# Patient Record
Sex: Female | Born: 1985 | Race: White | Hispanic: No | Marital: Married | Smoking: Former smoker
Health system: Southern US, Community
[De-identification: ages and names within clinical notes are randomized; demographics above are authoritative.]

## PROBLEM LIST (undated history)

## (undated) ENCOUNTER — Inpatient Hospital Stay (HOSPITAL_COMMUNITY): Payer: Self-pay

## (undated) ENCOUNTER — Inpatient Hospital Stay (HOSPITAL_COMMUNITY): Payer: PRIVATE HEALTH INSURANCE

## (undated) DIAGNOSIS — Z8759 Personal history of other complications of pregnancy, childbirth and the puerperium: Secondary | ICD-10-CM

## (undated) DIAGNOSIS — N879 Dysplasia of cervix uteri, unspecified: Secondary | ICD-10-CM

## (undated) DIAGNOSIS — R030 Elevated blood-pressure reading, without diagnosis of hypertension: Secondary | ICD-10-CM

## (undated) DIAGNOSIS — F988 Other specified behavioral and emotional disorders with onset usually occurring in childhood and adolescence: Secondary | ICD-10-CM

## (undated) HISTORY — PX: WISDOM TOOTH EXTRACTION: SHX21

## (undated) HISTORY — DX: Dysplasia of cervix uteri, unspecified: N87.9

## (undated) HISTORY — DX: Other specified behavioral and emotional disorders with onset usually occurring in childhood and adolescence: F98.8

## (undated) HISTORY — DX: Elevated blood-pressure reading, without diagnosis of hypertension: R03.0

## (undated) HISTORY — PX: DILATION AND CURETTAGE OF UTERUS: SHX78

## (undated) HISTORY — DX: Personal history of other complications of pregnancy, childbirth and the puerperium: Z87.59

---

## 2012-12-17 DIAGNOSIS — R030 Elevated blood-pressure reading, without diagnosis of hypertension: Secondary | ICD-10-CM | POA: Insufficient documentation

## 2012-12-17 DIAGNOSIS — N879 Dysplasia of cervix uteri, unspecified: Secondary | ICD-10-CM | POA: Insufficient documentation

## 2012-12-17 DIAGNOSIS — F988 Other specified behavioral and emotional disorders with onset usually occurring in childhood and adolescence: Secondary | ICD-10-CM | POA: Insufficient documentation

## 2012-12-23 ENCOUNTER — Ambulatory Visit (INDEPENDENT_AMBULATORY_CARE_PROVIDER_SITE_OTHER): Payer: PRIVATE HEALTH INSURANCE | Admitting: Family Medicine

## 2012-12-23 ENCOUNTER — Encounter: Payer: Self-pay | Admitting: Family Medicine

## 2012-12-23 VITALS — BP 126/85 | HR 87 | Ht 60.5 in | Wt 101.0 lb

## 2012-12-23 DIAGNOSIS — Z0000 Encounter for general adult medical examination without abnormal findings: Secondary | ICD-10-CM

## 2012-12-23 LAB — POCT URINALYSIS DIPSTICK
Bilirubin, UA: NEGATIVE
Blood, UA: NEGATIVE
Glucose, UA: NEGATIVE
Ketones, UA: NEGATIVE
Leukocytes, UA: NEGATIVE
Nitrite, UA: NEGATIVE
Protein, UA: NEGATIVE
Spec Grav, UA: 1.02
Urobilinogen, UA: NEGATIVE
pH, UA: 5

## 2012-12-23 MED ORDER — ETONOGESTREL-ETHINYL ESTRADIOL 0.12-0.015 MG/24HR VA RING: VAGINAL_RING | VAGINAL | Status: DC

## 2012-12-23 MED ORDER — FOLIC ACID 1 MG PO TABS: 1.0000 mg | ORAL_TABLET | Freq: Every day | ORAL | Status: AC

## 2012-12-23 MED ORDER — LISDEXAMFETAMINE DIMESYLATE 70 MG PO CAPS: 70.0000 mg | ORAL_CAPSULE | Freq: Every day | ORAL | Status: DC

## 2012-12-23 NOTE — Progress Notes (Signed)
Patient ID: Diana Palmer, female   DOB: [birth year], 27 y.o.   MRN: [id number]    HPI Diana Palmer is here today for her annual CPE.  She has done well since her last office visit.  She needs refills for her Vyvanse and Nuvaring.  Her dosage seems to be appropriate.  She denies sleeping or eating problems.    Past Medical History  Diagnosis Date  . Cervical dysplasia   . Attention deficit disorder without mention of hyperactivity   . Elevated blood pressure reading without diagnosis of hypertension    No past surgical history on file. Family History  Problem Relation Age of Onset  . Hypertension Father   . Breast cancer Paternal Grandmother   . Hypertension Paternal Grandfather     Social History History  Substance Use Topics  . Smoking status: Former Smoker -- 1.00 packs/day for 1 years    Types: Cigarettes    Quit date: 10/05/2006  . Smokeless tobacco: Not on file  . Alcohol Use: 2.5 oz/week    5 drink(s) per week     Comment: She drinks beer and wine.     Current Outpatient Prescriptions  Medication Sig Dispense Refill  . etonogestrel-ethinyl estradiol (NUVARING) 0.12-0.015 MG/24HR vaginal ring Insert vaginally and leave in place for 3 consecutive weeks, then remove for 1 week.  1 each  11  . lisdexamfetamine (VYVANSE) 70 MG capsule Take 1 capsule (70 mg total) by mouth daily.  30 capsule  0  . lisdexamfetamine (VYVANSE) 70 MG capsule Take 1 capsule (70 mg total) by mouth daily.  30 capsule  0  . folic acid (FOLVITE) 1 MG tablet Take 1 tablet (1 mg total) by mouth daily.  90 tablet  3   No current facility-administered medications for this visit.   No Known Allergies  Review of Systems  Constitutional: Negative.  Negative for fever, chills, diaphoresis, activity change, appetite change, fatigue and unexpected weight change.  HENT: Negative for hearing loss, ear pain, nosebleeds, congestion, sore throat, facial swelling, rhinorrhea, sneezing, drooling, mouth sores, trouble  swallowing, neck pain, neck stiffness, dental problem, voice change, postnasal drip, sinus pressure, tinnitus and ear discharge.   Eyes: Negative for photophobia, pain, discharge, redness, itching and visual disturbance.  Respiratory: Negative for apnea, cough, choking, chest tightness, shortness of breath, wheezing and stridor.   Cardiovascular: Negative for chest pain, palpitations and leg swelling.  Gastrointestinal: Negative for nausea, vomiting, abdominal pain, diarrhea, constipation, blood in stool, abdominal distention, anal bleeding and rectal pain.  Endocrine: Negative for cold intolerance, heat intolerance, polydipsia, polyphagia and polyuria.  Genitourinary: Negative for dysuria, urgency, frequency, hematuria, flank pain, decreased urine volume, vaginal bleeding, vaginal discharge, enuresis, difficulty urinating, genital sores, vaginal pain, menstrual problem, pelvic pain and dyspareunia.  Musculoskeletal: Negative for myalgias, back pain, joint swelling, arthralgias and gait problem.  Skin: Negative for color change, pallor, rash and wound.  Allergic/Immunologic: Negative for environmental allergies, food allergies and immunocompromised state.  Neurological: Negative for dizziness, tremors, seizures, syncope, facial asymmetry, speech difficulty, weakness, light-headedness, numbness and headaches.  Hematological: Negative for adenopathy. Does not bruise/bleed easily.  Psychiatric/Behavioral: Negative for suicidal ideas, hallucinations, behavioral problems, confusion, sleep disturbance, self-injury, dysphoric mood, decreased concentration and agitation. The patient is not nervous/anxious and is not hyperactive.   All other systems reviewed and are negative.   BP 126/85  Pulse 87  Ht 5' 0.5" (1.537 m)  Wt 101 lb (45.813 kg)  BMI 19.39 kg/m2  LMP 12/03/2012 Physical Exam  Constitutional:  She is oriented to person, place, and time. She appears well-developed and well-nourished.  HENT:   Head: Normocephalic and atraumatic.  Right Ear: External ear normal.  Left Ear: External ear normal.  Nose: Nose normal.  Mouth/Throat: Oropharynx is clear and moist.  Eyes: Conjunctivae are normal. Pupils are equal, round, and reactive to light. No scleral icterus.  Neck: Normal range of motion. Neck supple. No thyromegaly present.  Cardiovascular: Normal rate, regular rhythm, normal heart sounds and intact distal pulses.  Exam reveals no gallop and no friction rub.   No murmur heard. Pulmonary/Chest: Effort normal and breath sounds normal. Right breast exhibits no mass, no nipple discharge, no skin change and no tenderness. Left breast exhibits no mass, no nipple discharge, no skin change and no tenderness. Breasts are symmetrical.  Abdominal: Soft. Bowel sounds are normal. Hernia confirmed negative in the right inguinal area and confirmed negative in the left inguinal area.  Genitourinary: Rectum normal, vagina normal and uterus normal. No breast swelling, tenderness or discharge. Cervix exhibits no motion tenderness, no discharge and no friability. Right adnexum displays no mass, no tenderness and no fullness. Left adnexum displays no mass, no tenderness and no fullness.  Musculoskeletal: Normal range of motion. She exhibits no edema and no tenderness.  Lymphadenopathy:    She has no cervical adenopathy.       Right: No inguinal adenopathy present.       Left: No inguinal adenopathy present.  Neurological: She is alert and oriented to person, place, and time. She has normal reflexes.  Skin: Skin is warm and dry.  Psychiatric: She has a normal mood and affect. Her behavior is normal. Judgment and thought content normal.    Assessment:  1)  CPE 2)  ADD    Plan:  We discussed the new guidelines for pap smear. We'll do her next pap after she turns 28 and will check her HPV status at that time.  She and her husband are thinking about having kids in the future. She is to start on folic  acid.  She was given refills for her Vyvanse and her Nuvaring.

## 2012-12-23 NOTE — Patient Instructions (Addendum)
Preventive Care for Adults, Female A healthy lifestyle and preventive care can promote health and wellness. Preventive health guidelines for women include the following key practices.  A routine yearly physical is a good way to check with your caregiver about your health and preventive screening. It is a chance to share any concerns and updates on your health, and to receive a thorough exam.  Visit your dentist for a routine exam and preventive care every 6 months. Brush your teeth twice a day and floss once a day. Good oral hygiene prevents tooth decay and gum disease.  The frequency of eye exams is based on your age, health, family medical history, use of contact lenses, and other factors. Follow your caregiver's recommendations for frequency of eye exams.  Eat a healthy diet. Foods like vegetables, fruits, whole grains, low-fat dairy products, and lean protein foods contain the nutrients you need without too many calories. Decrease your intake of foods high in solid fats, added sugars, and salt. Eat the right amount of calories for you.Get information about a proper diet from your caregiver, if necessary.  Regular physical exercise is one of the most important things you can do for your health. Most adults should get at least 150 minutes of moderate-intensity exercise (any activity that increases your heart rate and causes you to sweat) each week. In addition, most adults need muscle-strengthening exercises on 2 or more days a week.  Maintain a healthy weight. The body mass index (BMI) is a screening tool to identify possible weight problems. It provides an estimate of body fat based on height and weight. Your caregiver can help determine your BMI, and can help you achieve or maintain a healthy weight.For adults 20 years and older:  A BMI below 18.5 is considered underweight.  A BMI of 18.5 to 24.9 is normal.  A BMI of 25 to 29.9 is considered overweight.  A BMI of 30 and above is  considered obese.  Maintain normal blood lipids and cholesterol levels by exercising and minimizing your intake of saturated fat. Eat a balanced diet with plenty of fruit and vegetables. Blood tests for lipids and cholesterol should begin at age 20 and be repeated every 5 years. If your lipid or cholesterol levels are high, you are over 50, or you are at high risk for heart disease, you may need your cholesterol levels checked more frequently.Ongoing high lipid and cholesterol levels should be treated with medicines if diet and exercise are not effective.  If you smoke, find out from your caregiver how to quit. If you do not use tobacco, do not start.  If you are pregnant, do not drink alcohol. If you are breastfeeding, be very cautious about drinking alcohol. If you are not pregnant and choose to drink alcohol, do not exceed 1 drink per day. One drink is considered to be 12 ounces (355 mL) of beer, 5 ounces (148 mL) of wine, or 1.5 ounces (44 mL) of liquor.  Avoid use of street drugs. Do not share needles with anyone. Ask for help if you need support or instructions about stopping the use of drugs.  High blood pressure causes heart disease and increases the risk of stroke. Your blood pressure should be checked at least every 1 to 2 years. Ongoing high blood pressure should be treated with medicines if weight loss and exercise are not effective.  If you are 55 to 29 years old, ask your caregiver if you should take aspirin to prevent strokes.  Diabetes   screening involves taking a blood sample to check your fasting blood sugar level. This should be done once every 3 years, after age 45, if you are within normal weight and without risk factors for diabetes. Testing should be considered at a younger age or be carried out more frequently if you are overweight and have at least 1 risk factor for diabetes.  Breast cancer screening is essential preventive care for women. You should practice "breast  self-awareness." This means understanding the normal appearance and feel of your breasts and may include breast self-examination. Any changes detected, no matter how small, should be reported to a caregiver. Women in their 20s and 30s should have a clinical breast exam (CBE) by a caregiver as part of a regular health exam every 1 to 3 years. After age 40, women should have a CBE every year. Starting at age 40, women should consider having a mammography (breast X-ray test) every year. Women who have a family history of breast cancer should talk to their caregiver about genetic screening. Women at a high risk of breast cancer should talk to their caregivers about having magnetic resonance imaging (MRI) and a mammography every year.  The Pap test is a screening test for cervical cancer. A Pap test can show cell changes on the cervix that might become cervical cancer if left untreated. A Pap test is a procedure in which cells are obtained and examined from the lower end of the uterus (cervix).  Women should have a Pap test starting at age 21.  Between ages 21 and 29, Pap tests should be repeated every 2 years.  Beginning at age 30, you should have a Pap test every 3 years as long as the past 3 Pap tests have been normal.  Some women have medical problems that increase the chance of getting cervical cancer. Talk to your caregiver about these problems. It is especially important to talk to your caregiver if a new problem develops soon after your last Pap test. In these cases, your caregiver may recommend more frequent screening and Pap tests.  The above recommendations are the same for women who have or have not gotten the vaccine for human papillomavirus (HPV).  If you had a hysterectomy for a problem that was not cancer or a condition that could lead to cancer, then you no longer need Pap tests. Even if you no longer need a Pap test, a regular exam is a good idea to make sure no other problems are  starting.  If you are between ages 65 and 70, and you have had normal Pap tests going back 10 years, you no longer need Pap tests. Even if you no longer need a Pap test, a regular exam is a good idea to make sure no other problems are starting.  If you have had past treatment for cervical cancer or a condition that could lead to cancer, you need Pap tests and screening for cancer for at least 20 years after your treatment.  If Pap tests have been discontinued, risk factors (such as a new sexual partner) need to be reassessed to determine if screening should be resumed.  The HPV test is an additional test that may be used for cervical cancer screening. The HPV test looks for the virus that can cause the cell changes on the cervix. The cells collected during the Pap test can be tested for HPV. The HPV test could be used to screen women aged 30 years and older, and should   be used in women of any age who have unclear Pap test results. After the age of 30, women should have HPV testing at the same frequency as a Pap test.  Colorectal cancer can be detected and often prevented. Most routine colorectal cancer screening begins at the age of 50 and continues through age 75. However, your caregiver may recommend screening at an earlier age if you have risk factors for colon cancer. On a yearly basis, your caregiver may provide home test kits to check for hidden blood in the stool. Use of a small camera at the end of a tube, to directly examine the colon (sigmoidoscopy or colonoscopy), can detect the earliest forms of colorectal cancer. Talk to your caregiver about this at age 50, when routine screening begins. Direct examination of the colon should be repeated every 5 to 10 years through age 75, unless early forms of pre-cancerous polyps or small growths are found.  Hepatitis C blood testing is recommended for all people born from 1945 through 1965 and any individual with known risks for hepatitis C.  Practice  safe sex. Use condoms and avoid high-risk sexual practices to reduce the spread of sexually transmitted infections (STIs). STIs include gonorrhea, chlamydia, syphilis, trichomonas, herpes, HPV, and human immunodeficiency virus (HIV). Herpes, HIV, and HPV are viral illnesses that have no cure. They can result in disability, cancer, and death. Sexually active women aged 25 and younger should be checked for chlamydia. Older women with new or multiple partners should also be tested for chlamydia. Testing for other STIs is recommended if you are sexually active and at increased risk.  Osteoporosis is a disease in which the bones lose minerals and strength with aging. This can result in serious bone fractures. The risk of osteoporosis can be identified using a bone density scan. Women ages 65 and over and women at risk for fractures or osteoporosis should discuss screening with their caregivers. Ask your caregiver whether you should take a calcium supplement or vitamin D to reduce the rate of osteoporosis.  Menopause can be associated with physical symptoms and risks. Hormone replacement therapy is available to decrease symptoms and risks. You should talk to your caregiver about whether hormone replacement therapy is right for you.  Use sunscreen with sun protection factor (SPF) of 30 or more. Apply sunscreen liberally and repeatedly throughout the day. You should seek shade when your shadow is shorter than you. Protect yourself by wearing long sleeves, pants, a wide-brimmed hat, and sunglasses year round, whenever you are outdoors.  Once a month, do a whole body skin exam, using a mirror to look at the skin on your back. Notify your caregiver of new moles, moles that have irregular borders, moles that are larger than a pencil eraser, or moles that have changed in shape or color.  Stay current with required immunizations.  Influenza. You need a dose every fall (or winter). The composition of the flu vaccine  changes each year, so being vaccinated once is not enough.  Pneumococcal polysaccharide. You need 1 to 2 doses if you smoke cigarettes or if you have certain chronic medical conditions. You need 1 dose at age 65 (or older) if you have never been vaccinated.  Tetanus, diphtheria, pertussis (Tdap, Td). Get 1 dose of Tdap vaccine if you are younger than age 65, are over 65 and have contact with an infant, are a healthcare worker, are pregnant, or simply want to be protected from whooping cough. After that, you need a Td   booster dose every 10 years. Consult your caregiver if you have not had at least 3 tetanus and diphtheria-containing shots sometime in your life or have a deep or dirty wound.  HPV. You need this vaccine if you are a woman age 26 or younger. The vaccine is given in 3 doses over 6 months.  Measles, mumps, rubella (MMR). You need at least 1 dose of MMR if you were born in 1957 or later. You may also need a second dose.  Meningococcal. If you are age 19 to 21 and a first-year college student living in a residence hall, or have one of several medical conditions, you need to get vaccinated against meningococcal disease. You may also need additional booster doses.  Zoster (shingles). If you are age 60 or older, you should get this vaccine.  Varicella (chickenpox). If you have never had chickenpox or you were vaccinated but received only 1 dose, talk to your caregiver to find out if you need this vaccine.  Hepatitis A. You need this vaccine if you have a specific risk factor for hepatitis A virus infection or you simply wish to be protected from this disease. The vaccine is usually given as 2 doses, 6 to 18 months apart.  Hepatitis B. You need this vaccine if you have a specific risk factor for hepatitis B virus infection or you simply wish to be protected from this disease. The vaccine is given in 3 doses, usually over 6 months. Preventive Services / Frequency Ages 19 to 39  Blood  pressure check.** / Every 1 to 2 years.  Lipid and cholesterol check.** / Every 5 years beginning at age 20.  Clinical breast exam.** / Every 3 years for women in their 20s and 30s.  Pap test.** / Every 2 years from ages 21 through 29. Every 3 years starting at age 30 through age 65 or 70 with a history of 3 consecutive normal Pap tests.  HPV screening.** / Every 3 years from ages 30 through ages 65 to 70 with a history of 3 consecutive normal Pap tests.  Hepatitis C blood test.** / For any individual with known risks for hepatitis C.  Skin self-exam. / Monthly.  Influenza immunization.** / Every year.  Pneumococcal polysaccharide immunization.** / 1 to 2 doses if you smoke cigarettes or if you have certain chronic medical conditions.  Tetanus, diphtheria, pertussis (Tdap, Td) immunization. / A one-time dose of Tdap vaccine. After that, you need a Td booster dose every 10 years.  HPV immunization. / 3 doses over 6 months, if you are 26 and younger.  Measles, mumps, rubella (MMR) immunization. / You need at least 1 dose of MMR if you were born in 1957 or later. You may also need a second dose.  Meningococcal immunization. / 1 dose if you are age 19 to 21 and a first-year college student living in a residence hall, or have one of several medical conditions, you need to get vaccinated against meningococcal disease. You may also need additional booster doses.  Varicella immunization.** / Consult your caregiver.  Hepatitis A immunization.** / Consult your caregiver. 2 doses, 6 to 18 months apart.  Hepatitis B immunization.** / Consult your caregiver. 3 doses usually over 6 months. Ages 40 to 64  Blood pressure check.** / Every 1 to 2 years.  Lipid and cholesterol check.** / Every 5 years beginning at age 20.  Clinical breast exam.** / Every year after age 40.  Mammogram.** / Every year beginning at age 40   and continuing for as long as you are in good health. Consult with your  caregiver.  Pap test.** / Every 3 years starting at age 30 through age 65 or 70 with a history of 3 consecutive normal Pap tests.  HPV screening.** / Every 3 years from ages 30 through ages 65 to 70 with a history of 3 consecutive normal Pap tests.  Fecal occult blood test (FOBT) of stool. / Every year beginning at age 50 and continuing until age 75. You may not need to do this test if you get a colonoscopy every 10 years.  Flexible sigmoidoscopy or colonoscopy.** / Every 5 years for a flexible sigmoidoscopy or every 10 years for a colonoscopy beginning at age 50 and continuing until age 75.  Hepatitis C blood test.** / For all people born from 1945 through 1965 and any individual with known risks for hepatitis C.  Skin self-exam. / Monthly.  Influenza immunization.** / Every year.  Pneumococcal polysaccharide immunization.** / 1 to 2 doses if you smoke cigarettes or if you have certain chronic medical conditions.  Tetanus, diphtheria, pertussis (Tdap, Td) immunization.** / A one-time dose of Tdap vaccine. After that, you need a Td booster dose every 10 years.  Measles, mumps, rubella (MMR) immunization. / You need at least 1 dose of MMR if you were born in 1957 or later. You may also need a second dose.  Varicella immunization.** / Consult your caregiver.  Meningococcal immunization.** / Consult your caregiver.  Hepatitis A immunization.** / Consult your caregiver. 2 doses, 6 to 18 months apart.  Hepatitis B immunization.** / Consult your caregiver. 3 doses, usually over 6 months. Ages 65 and over  Blood pressure check.** / Every 1 to 2 years.  Lipid and cholesterol check.** / Every 5 years beginning at age 20.  Clinical breast exam.** / Every year after age 40.  Mammogram.** / Every year beginning at age 40 and continuing for as long as you are in good health. Consult with your caregiver.  Pap test.** / Every 3 years starting at age 30 through age 65 or 70 with a 3  consecutive normal Pap tests. Testing can be stopped between 65 and 70 with 3 consecutive normal Pap tests and no abnormal Pap or HPV tests in the past 10 years.  HPV screening.** / Every 3 years from ages 30 through ages 65 or 70 with a history of 3 consecutive normal Pap tests. Testing can be stopped between 65 and 70 with 3 consecutive normal Pap tests and no abnormal Pap or HPV tests in the past 10 years.  Fecal occult blood test (FOBT) of stool. / Every year beginning at age 50 and continuing until age 75. You may not need to do this test if you get a colonoscopy every 10 years.  Flexible sigmoidoscopy or colonoscopy.** / Every 5 years for a flexible sigmoidoscopy or every 10 years for a colonoscopy beginning at age 50 and continuing until age 75.  Hepatitis C blood test.** / For all people born from 1945 through 1965 and any individual with known risks for hepatitis C.  Osteoporosis screening.** / A one-time screening for women ages 65 and over and women at risk for fractures or osteoporosis.  Skin self-exam. / Monthly.  Influenza immunization.** / Every year.  Pneumococcal polysaccharide immunization.** / 1 dose at age 65 (or older) if you have never been vaccinated.  Tetanus, diphtheria, pertussis (Tdap, Td) immunization. / A one-time dose of Tdap vaccine if you are over   65 and have contact with an infant, are a healthcare worker, or simply want to be protected from whooping cough. After that, you need a Td booster dose every 10 years.  Varicella immunization.** / Consult your caregiver.  Meningococcal immunization.** / Consult your caregiver.  Hepatitis A immunization.** / Consult your caregiver. 2 doses, 6 to 18 months apart.  Hepatitis B immunization.** / Check with your caregiver. 3 doses, usually over 6 months. ** Family history and personal history of risk and conditions may change your caregiver's recommendations. Document Released: 11/17/2001 Document Revised: 12/14/2011  Document Reviewed: 02/16/2011 [employer] Patient Information 2013 [employer].  

## 2012-12-25 ENCOUNTER — Encounter: Payer: Self-pay | Admitting: Family Medicine

## 2013-07-07 ENCOUNTER — Ambulatory Visit (INDEPENDENT_AMBULATORY_CARE_PROVIDER_SITE_OTHER): Payer: PRIVATE HEALTH INSURANCE | Admitting: Family Medicine

## 2013-07-07 ENCOUNTER — Encounter: Payer: Self-pay | Admitting: Family Medicine

## 2013-07-07 VITALS — BP 119/79 | HR 70 | Resp 16 | Ht 60.0 in | Wt 100.0 lb

## 2013-07-07 DIAGNOSIS — R41840 Attention and concentration deficit: Secondary | ICD-10-CM

## 2013-07-07 DIAGNOSIS — R197 Diarrhea, unspecified: Secondary | ICD-10-CM

## 2013-07-07 MED ORDER — DIPHENOXYLATE-ATROPINE 2.5-0.025 MG PO TABS: ORAL_TABLET | ORAL | Status: AC

## 2013-07-07 MED ORDER — LISDEXAMFETAMINE DIMESYLATE 70 MG PO CAPS: 70.0000 mg | ORAL_CAPSULE | Freq: Every day | ORAL | Status: DC

## 2013-07-07 NOTE — Patient Instructions (Addendum)
1)  Diarrhea - Continue on the Lomotil for now. In 2-7 days you may try weaning down to Immodium.  Continue on fluid replacement and the BRAT diet.  If you are not any better in 2 weeks then we'll do the stool studies.  Replace your gut with "Good" bacteria (Probiotics) e.g. Align, Activia Yogurt and Bigelow Honey Ginger Tea with Probiotics.     Diet for Diarrhea, Adult Frequent, runny stools (diarrhea) may be caused or worsened by food or drink. Diarrhea may be relieved by changing your diet. Since diarrhea can last up to 7 days, it is easy for you to lose too much fluid from the body and become dehydrated. Fluids that are lost need to be replaced. Along with a modified diet, make sure you drink enough fluids to keep your urine clear or pale yellow. DIET INSTRUCTIONS  Ensure adequate fluid intake (hydration): have 1 cup (8 oz) of fluid for each diarrhea episode. Avoid fluids that contain simple sugars or sports drinks, fruit juices, whole milk products, and sodas. Your urine should be clear or pale yellow if you are drinking enough fluids. Hydrate with an oral rehydration solution that you can purchase at pharmacies, retail stores, and online. You can prepare an oral rehydration solution at home by mixing the following ingredients together:    tsp table salt.   tsp baking soda.   tsp salt substitute containing potassium chloride.  1  tablespoons sugar.  1 L (34 oz) of water.  Certain foods and beverages may increase the speed at which food moves through the gastrointestinal (GI) tract. These foods and beverages should be avoided and include:  Caffeinated and alcoholic beverages.  High-fiber foods, such as raw fruits and vegetables, nuts, seeds, and whole grain breads and cereals.  Foods and beverages sweetened with sugar alcohols, such as xylitol, sorbitol, and mannitol.  Some foods may be well tolerated and may help thicken stool including:  Starchy foods, such as rice, toast, pasta,  low-sugar cereal, oatmeal, grits, baked potatoes, crackers, and bagels.   Bananas.   Applesauce.  Add probiotic-rich foods to help increase healthy bacteria in the GI tract, such as yogurt and fermented milk products. RECOMMENDED FOODS AND BEVERAGES Starches Choose foods with less than 2 g of fiber per serving.  Recommended:  White, [place], and pita breads, plain rolls, buns, bagels. Plain muffins, matzo. Soda, saltine, or graham crackers. Pretzels, melba toast, zwieback. Cooked cereals made with water: cornmeal, farina, cream cereals. Dry cereals: refined corn, wheat, rice. Potatoes prepared any way without skins, refined macaroni, spaghetti, noodles, refined rice.  Avoid:  Bread, rolls, or crackers made with whole wheat, multi-grains, rye, bran seeds, nuts, or coconut. Corn tortillas or taco shells. Cereals containing whole grains, multi-grains, bran, coconut, nuts, raisins. Cooked or dry oatmeal. Coarse wheat cereals, granola. Cereals advertised as "high-fiber." Potato skins. Whole grain pasta, wild or brown rice. Popcorn. Sweet potatoes, yams. Sweet rolls, doughnuts, waffles, pancakes, sweet breads. Vegetables  Recommended: Strained tomato and vegetable juices. Most well-cooked and canned vegetables without seeds. Fresh: Tender lettuce, cucumber without the skin, cabbage, spinach, bean sprouts.  Avoid: Fresh, cooked, or canned: Artichokes, baked beans, beet greens, broccoli, Brussels sprouts, corn, kale, legumes, peas, sweet potatoes. Cooked: Green or red cabbage, spinach. Avoid large servings of any vegetables because vegetables shrink when cooked, and they contain more fiber per serving than fresh vegetables. Fruit  Recommended: Cooked or canned: Apricots, applesauce, cantaloupe, cherries, fruit cocktail, grapefruit, grapes, kiwi, mandarin oranges, peaches, pears, plums, watermelon.  Fresh: Apples without skin, ripe banana, grapes, cantaloupe, cherries, grapefruit, peaches, oranges,  plums. Keep servings limited to  cup or 1 piece.  Avoid: Fresh: Apples with skin, apricots, mangoes, pears, raspberries, strawberries. Prune juice, stewed or dried prunes. Dried fruits, raisins, dates. Large servings of all fresh fruits. Protein  Recommended: Ground or well-cooked tender beef, ham, veal, lamb, pork, or poultry. Eggs. Fish, oysters, shrimp, lobster, other seafoods. Liver, organ meats.  Avoid: Tough, fibrous meats with gristle. Peanut butter, smooth or chunky. Cheese, nuts, seeds, legumes, dried peas, beans, lentils. Dairy  Recommended: Yogurt, lactose-free milk, kefir, drinkable yogurt, buttermilk, soy milk, or plain hard cheese.  Avoid: Milk, chocolate milk, beverages made with milk, such as milkshakes. Soups  Recommended: Bouillon, broth, or soups made from allowed foods. Any strained soup.  Avoid: Soups made from vegetables that are not allowed, cream or milk-based soups. Desserts and Sweets  Recommended: Sugar-free gelatin, sugar-free frozen ice pops made without sugar alcohol.  Avoid: Plain cakes and cookies, pie made with fruit, pudding, custard, cream pie. Gelatin, fruit, ice, sherbet, frozen ice pops. Ice cream, ice milk without nuts. Plain hard candy, honey, jelly, molasses, syrup, sugar, chocolate syrup, gumdrops, marshmallows. Fats and Oils  Recommended: Limit fats to less than 8 tsp per day.  Avoid: Seeds, nuts, olives, avocados. Margarine, butter, cream, mayonnaise, salad oils, plain salad dressings. Plain gravy, crisp bacon without rind. Beverages  Recommended: Water, decaffeinated teas, oral rehydration solutions, sugar-free beverages not sweetened with sugar alcohols.  Avoid: Fruit juices, caffeinated beverages (coffee, tea, soda), alcohol, sports drinks, or lemon-lime soda. Condiments  Recommended: Ketchup, mustard, horseradish, vinegar, cocoa powder. Spices                         in moderation: allspice, basil, bay leaves, celery powder or  leaves, cinnamon, cumin powder, curry powder, ginger, mace, marjoram, onion or garlic powder, oregano, paprika, parsley flakes, ground pepper, rosemary, sage, savory, tarragon, thyme, turmeric.  Avoid: Coconut, honey. Document Released: 12/12/2003 Document Revised: 06/15/2012 Document Reviewed: 02/05/2012 [facility] Patient Information 2014 [place], [place].

## 2013-07-07 NOTE — Progress Notes (Signed)
  Subjective:    Patient ID: Diana Palmer, female    DOB: March 27, 1986, 27 y.o.   MRN: [id number]  Diana Palmer is here today complaining of diarrhea.  She also would like to get her Vyvanse refilled.  She is taking a little more of it than she has been because she is taking 3 classes this semester.  She is on track to graduate from [employer] in May.    Diarrhea  This is a new problem. The current episode started in the past 7 days. The problem occurs 5 to 10 times per day. The problem has been waxing and waning. The stool consistency is described as watery. The patient states that diarrhea awakens her from sleep. Associated symptoms include chills (She felt chilled the first day but that improved.  ). Pertinent negatives include no abdominal pain, fever or vomiting. Associated symptoms comments: Abdominal discomfort . Nothing aggravates the symptoms. She has tried anti-motility drug and electrolyte solution (Lomotil Tables (prescribed for her husband)) for the symptoms. The treatment provided mild relief.    Review of Systems  Constitutional: Positive for chills (She felt chilled the first day but that improved.  ). Negative for fever.  HENT: Negative.   Eyes: Negative.   Respiratory: Negative.   Cardiovascular: Negative.   Gastrointestinal: Positive for diarrhea. Negative for nausea, vomiting, abdominal pain, blood in stool and abdominal distention.       Abdominal discomfort.   Endocrine: Negative.   Genitourinary: Negative.   Musculoskeletal: Negative.   Skin: Negative.   Allergic/Immunologic: Negative.   Neurological: Negative.   Hematological: Negative.   Psychiatric/Behavioral: Negative.     Past Medical History  Diagnosis Date  . Cervical dysplasia   . Attention deficit disorder without mention of hyperactivity   . Elevated blood pressure reading without diagnosis of hypertension     Family History  Problem Relation Age of Onset  . Hypertension Father   . Breast cancer Paternal  Grandmother   . Hypertension Paternal Grandfather     History   Social History Narrative   Marital Status:  Married Chief of Staff)   Pets:  Dog     Living Situation: Lives with her husband.     Occupation: [employer]    Education: Senior ([employer])      Tobacco Use:  She quit smoking in October 2008.     Alcohol Use:  Occasional   Drug Use:  None   Diet:  Regular   Exercise:  Treadmill/Weights    Hobbies:  Tennis                     Objective:   Physical Exam  Constitutional: No distress.  HENT:  Nose: Nose normal.  Mouth/Throat: Oropharynx is clear and moist. Mucous membranes are dry.  Eyes: No scleral icterus.  Neck: Neck supple.  Cardiovascular: Normal rate, regular rhythm and normal heart sounds.   Pulmonary/Chest: Effort normal and breath sounds normal.  Abdominal: Soft. Bowel sounds are normal. She exhibits no distension and no mass. There is tenderness (Mild tenderness ). There is no rebound and no guarding.  Musculoskeletal: She exhibits no edema.  Lymphadenopathy:    She has no cervical adenopathy.  Neurological: She is alert.  Skin: Skin is warm and dry. She is not diaphoretic.  Psychiatric: She has a normal mood and affect. Her behavior is normal. Judgment and thought content normal.     Assessment & Plan:

## 2013-07-08 ENCOUNTER — Encounter: Payer: Self-pay | Admitting: Family Medicine

## 2013-07-08 DIAGNOSIS — R197 Diarrhea, unspecified: Secondary | ICD-10-CM | POA: Insufficient documentation

## 2013-07-08 DIAGNOSIS — R41840 Attention and concentration deficit: Secondary | ICD-10-CM | POA: Insufficient documentation

## 2013-07-08 NOTE — Assessment & Plan Note (Signed)
Refilled her Vyvanse 

## 2013-07-08 NOTE — Assessment & Plan Note (Signed)
Diana Palmer's symptoms are consistent with a viral infection.  She was given a refill for the Lomotil.  She is to follow the BRAT diet and keep up with her fluids like she has been doing.  If she is not better in a another week or so or if she worsens, she is to bring back stool studies.

## 2013-12-21 ENCOUNTER — Ambulatory Visit (INDEPENDENT_AMBULATORY_CARE_PROVIDER_SITE_OTHER): Payer: PRIVATE HEALTH INSURANCE | Admitting: Family Medicine

## 2013-12-21 ENCOUNTER — Encounter: Payer: Self-pay | Admitting: Family Medicine

## 2013-12-21 VITALS — BP 141/91 | HR 87 | Resp 16 | Wt 102.0 lb

## 2013-12-21 DIAGNOSIS — R41840 Attention and concentration deficit: Secondary | ICD-10-CM

## 2013-12-21 MED ORDER — LISDEXAMFETAMINE DIMESYLATE 70 MG PO CAPS: 70.0000 mg | ORAL_CAPSULE | Freq: Every day | ORAL | Status: DC

## 2013-12-21 NOTE — Progress Notes (Signed)
   Subjective:    Patient ID: Diana ClarkEmily Best Palmer, female    DOB: 03/05/1986, 28 y.o.   MRN: [id number]  HPI  Diana Palmer is here today to get a refill on her ADD medication (Vyvanse 70 mg).  She says that the medication continues to help her concentrate at school/work.  She feels that this dosage is appropriate and would like to continue on it.     Review of Systems  Constitutional: Negative for appetite change, fatigue and unexpected weight change.  Cardiovascular: Negative for chest pain and palpitations.  Neurological: Negative for dizziness and headaches.  Psychiatric/Behavioral: Negative for behavioral problems, sleep disturbance, dysphoric mood, decreased concentration (Improved on medication. ) and agitation. The patient is not nervous/anxious and is not hyperactive.      Past Medical History  Diagnosis Date  . Cervical dysplasia   . Attention deficit disorder without mention of hyperactivity   . Elevated blood pressure reading without diagnosis of hypertension      No past surgical history on file.   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     Occupation: [employer]    Education: Senior ([employer])      Tobacco Use:  She quit smoking in October 2008.     Alcohol Use:  Occasional   Drug Use:  None   Diet:  Regular   Exercise:  Treadmill/Weights    Hobbies:  Tennis                  Family History  Problem Relation Age of Onset  . Hypertension Father   . Breast cancer Paternal Grandmother   . Hypertension Paternal Grandfather      Current Outpatient Prescriptions on File Prior to Visit  Medication Sig Dispense Refill  . etonogestrel-ethinyl estradiol (NUVARING) 0.12-0.015 MG/24HR vaginal ring Insert vaginally and leave in place for 3 consecutive weeks, then remove for 1 week.  1 each  11   No current facility-administered medications on file prior to visit.       No Known Allergies      Objective:   Physical Exam  Constitutional: She appears well-nourished. No distress.  Cardiovascular: Normal rate, regular rhythm and normal heart sounds.   Pulmonary/Chest: Effort normal and breath sounds normal.  Neurological: She is alert.  Psychiatric: She has a normal mood and affect. Her behavior is normal. Judgment and thought content normal.      Assessment & Plan:    Diana Palmer was seen today for medication management.  Diagnoses and associated orders for this visit:  Concentration deficit - lisdexamfetamine (VYVANSE) 70 MG capsule; Take 1 capsule (70 mg total) by mouth daily. - lisdexamfetamine (VYVANSE) 70 MG capsule; Take 1 capsule (70 mg total) by mouth daily. - lisdexamfetamine (VYVANSE) 70 MG capsule; Take 1 capsule (70 mg total) by mouth daily.

## 2014-01-14 ENCOUNTER — Other Ambulatory Visit: Payer: Self-pay | Admitting: Family Medicine

## 2014-04-11 ENCOUNTER — Telehealth: Payer: Self-pay

## 2014-04-18 NOTE — Telephone Encounter (Signed)
Error

## 2014-05-08 ENCOUNTER — Ambulatory Visit: Payer: PRIVATE HEALTH INSURANCE | Admitting: Family Medicine

## 2014-10-05 NOTE — L&D Delivery Note (Signed)
Delivery Note  Patient presented to MAU with hx of likely SROM at 0530, with onset of strong contractions around 9am.  She was complete on presentation, with vtx +2 station.  [facility] due to imminent delivery.  NICU was notified, and the NICU team was present prior to delivery.    At 11:08 AM a viable female, as yet unnamed, was delivered via Vaginal, Spontaneous Delivery (Presentation: Left Occiput Anterior).  APGAR: 9, 9; weight 4 lb 3.7 oz (1920 g).   Placenta status: Intact, Pathology Manual removal.  Cord: 3 vessels with the following complications: Marginal insertion.  Cord pH: NA  Anesthesia: None.  Episiotomy: None Lacerations: Bilateral periurethrals, no repair required, hemostatic. Suture Repair: None Est. Blood Loss (mL): 200  I felt the cord pull away from the placenta, but no avulsion occurred.  Gentle manual exploration of vagina and lower uterine segment revealed placenta just inside cervical os. Pitocin infusion begun per pp protocol, gentle fundal massage done, and placenta delivered intact.  Appeared calcified, with marginal insertion, ? Possibly velamentous.  Initial BP on arrival was 165/85--160/101, 150/99 prior to delivery. Range 128-170/89-95 during recovery phase in MAU.  PIH labs and PCR by cath done in MAU--will await results, and continue to observe BP at present.  Mom to 3rd floor.Pecola Leisure.  Baby to NICU. Placenta to pathology.  Cadell Gabrielson 11/09/2014, 12:06 PM

## 2014-11-09 ENCOUNTER — Encounter (HOSPITAL_COMMUNITY): Payer: Self-pay | Admitting: *Deleted

## 2014-11-09 ENCOUNTER — Inpatient Hospital Stay (HOSPITAL_COMMUNITY)
Admission: AD | Admit: 2014-11-09 | Discharge: 2014-11-12 | DRG: 767 | Disposition: A | Discharge status: Home / Self Care | Payer: 59 | Source: Ambulatory Visit | Attending: Obstetrics & Gynecology | Admitting: Obstetrics & Gynecology

## 2014-11-09 DIAGNOSIS — Z87891 Personal history of nicotine dependence: Secondary | ICD-10-CM | POA: Diagnosis not present

## 2014-11-09 DIAGNOSIS — D649 Anemia, unspecified: Secondary | ICD-10-CM | POA: Diagnosis present

## 2014-11-09 DIAGNOSIS — Z3A33 33 weeks gestation of pregnancy: Secondary | ICD-10-CM | POA: Diagnosis present

## 2014-11-09 DIAGNOSIS — O9902 Anemia complicating childbirth: Secondary | ICD-10-CM | POA: Diagnosis present

## 2014-11-09 DIAGNOSIS — O43123 Velamentous insertion of umbilical cord, third trimester: Secondary | ICD-10-CM | POA: Diagnosis present

## 2014-11-09 DIAGNOSIS — O09899 Supervision of other high risk pregnancies, unspecified trimester: Secondary | ICD-10-CM

## 2014-11-09 DIAGNOSIS — O139 Gestational [pregnancy-induced] hypertension without significant proteinuria, unspecified trimester: Secondary | ICD-10-CM | POA: Diagnosis present

## 2014-11-09 DIAGNOSIS — Z87410 Personal history of cervical dysplasia: Secondary | ICD-10-CM

## 2014-11-09 DIAGNOSIS — Z2839 Other underimmunization status: Secondary | ICD-10-CM

## 2014-11-09 DIAGNOSIS — Z8249 Family history of ischemic heart disease and other diseases of the circulatory system: Secondary | ICD-10-CM | POA: Diagnosis not present

## 2014-11-09 DIAGNOSIS — Z283 Underimmunization status: Secondary | ICD-10-CM

## 2014-11-09 DIAGNOSIS — O9989 Other specified diseases and conditions complicating pregnancy, childbirth and the puerperium: Secondary | ICD-10-CM

## 2014-11-09 DIAGNOSIS — O133 Gestational [pregnancy-induced] hypertension without significant proteinuria, third trimester: Secondary | ICD-10-CM | POA: Diagnosis present

## 2014-11-09 DIAGNOSIS — IMO0002 Reserved for concepts with insufficient information to code with codable children: Secondary | ICD-10-CM | POA: Diagnosis present

## 2014-11-09 LAB — CBC
HEMATOCRIT: 33.6 % — AB (ref 36.0–46.0)
HEMOGLOBIN: 11.8 g/dL — AB (ref 12.0–15.0)
MCH: 33.1 pg (ref 26.0–34.0)
MCHC: 35.1 g/dL (ref 30.0–36.0)
MCV: 94.1 fL (ref 78.0–100.0)
Platelets: 212 10*3/uL (ref 150–400)
RBC: 3.57 MIL/uL — ABNORMAL LOW (ref 3.87–5.11)
RDW: 12.5 % (ref 11.5–15.5)
WBC: 16.4 10*3/uL — AB (ref 4.0–10.5)

## 2014-11-09 LAB — URINALYSIS, ROUTINE W REFLEX MICROSCOPIC
BILIRUBIN URINE: NEGATIVE
GLUCOSE, UA: NEGATIVE mg/dL
KETONES UR: NEGATIVE mg/dL
Leukocytes, UA: NEGATIVE
Nitrite: NEGATIVE
PH: 6 (ref 5.0–8.0)
Protein, ur: NEGATIVE mg/dL
Specific Gravity, Urine: <1.005 — ABNORMAL LOW (ref 1.005–1.030)
Urobilinogen, UA: 0.2 mg/dL (ref 0.0–1.0)

## 2014-11-09 LAB — COMPREHENSIVE METABOLIC PANEL
ALBUMIN: 2.8 g/dL — AB (ref 3.5–5.2)
ALT: 12 U/L (ref 0–35)
ANION GAP: 11 (ref 5–15)
AST: 32 U/L (ref 0–37)
Alkaline Phosphatase: 108 U/L (ref 39–117)
BUN: 14 mg/dL (ref 6–23)
CHLORIDE: 106 mmol/L (ref 96–112)
CO2: 14 mmol/L — ABNORMAL LOW (ref 19–32)
Calcium: 8.6 mg/dL (ref 8.4–10.5)
Creatinine, Ser: 0.87 mg/dL (ref 0.50–1.10)
GFR calc Af Amer: >90 mL/min (ref >90)
GFR calc non Af Amer: 89 mL/min — ABNORMAL LOW (ref >90)
Glucose, Bld: 101 mg/dL — ABNORMAL HIGH (ref 70–99)
Potassium: 3.5 mmol/L (ref 3.5–5.1)
SODIUM: 131 mmol/L — AB (ref 135–145)
Total Bilirubin: 0.5 mg/dL (ref 0.3–1.2)
Total Protein: 5.7 g/dL — ABNORMAL LOW (ref 6.0–8.3)

## 2014-11-09 LAB — TYPE AND SCREEN
ABO/RH(D): A POS
Antibody Screen: NEGATIVE

## 2014-11-09 LAB — PROTEIN / CREATININE RATIO, URINE
Creatinine, Urine: 74 mg/dL
Protein Creatinine Ratio: 0.24 — ABNORMAL HIGH (ref 0.00–0.15)
Total Protein, Urine: 18 mg/dL

## 2014-11-09 LAB — URIC ACID: URIC ACID, SERUM: 7 mg/dL (ref 2.4–7.0)

## 2014-11-09 LAB — URINE MICROSCOPIC-ADD ON

## 2014-11-09 LAB — ABO/RH: ABO/RH(D): A POS

## 2014-11-09 LAB — LACTATE DEHYDROGENASE: LDH: 171 U/L (ref 94–250)

## 2014-11-09 MED ORDER — PRENATAL MULTIVITAMIN CH
1.0000 | ORAL_TABLET | Freq: Every day | ORAL | Status: DC
  Administered 2014-11-09 – 2014-11-12 (×4): 1 via ORAL
  Filled 2014-11-09 (×4): qty 1

## 2014-11-09 MED ORDER — OXYTOCIN BOLUS FROM INFUSION: 500.0000 mL | INTRAVENOUS | Status: DC

## 2014-11-09 MED ORDER — SIMETHICONE 80 MG PO CHEW: 80.0000 mg | CHEWABLE_TABLET | ORAL | Status: DC | PRN

## 2014-11-09 MED ORDER — ONDANSETRON HCL 4 MG PO TABS: 4.0000 mg | ORAL_TABLET | ORAL | Status: DC | PRN

## 2014-11-09 MED ORDER — ONDANSETRON HCL 4 MG/2ML IJ SOLN: 4.0000 mg | INTRAMUSCULAR | Status: DC | PRN

## 2014-11-09 MED ORDER — FENTANYL CITRATE 0.05 MG/ML IJ SOLN
100.0000 ug | INTRAMUSCULAR | Status: DC | PRN
Start: 2014-11-09 — End: 2014-11-09
  Administered 2014-11-09: 100 ug via INTRAVENOUS

## 2014-11-09 MED ORDER — OXYTOCIN 40 UNITS IN LACTATED RINGERS INFUSION - SIMPLE MED
62.5000 mL/h | INTRAVENOUS | Status: DC
  Administered 2014-11-09: 62.5 mL/h via INTRAVENOUS

## 2014-11-09 MED ORDER — ZOLPIDEM TARTRATE 5 MG PO TABS: 5.0000 mg | ORAL_TABLET | Freq: Every evening | ORAL | Status: DC | PRN

## 2014-11-09 MED ORDER — DIPHENHYDRAMINE HCL 25 MG PO CAPS: 25.0000 mg | ORAL_CAPSULE | Freq: Four times a day (QID) | ORAL | Status: DC | PRN

## 2014-11-09 MED ORDER — LABETALOL HCL 100 MG PO TABS
100.0000 mg | ORAL_TABLET | Freq: Two times a day (BID) | ORAL | Status: DC
  Administered 2014-11-09: 100 mg via ORAL
  Filled 2014-11-09 (×2): qty 1

## 2014-11-09 MED ORDER — DIBUCAINE 1 % RE OINT: 1.0000 "application " | TOPICAL_OINTMENT | RECTAL | Status: DC | PRN

## 2014-11-09 MED ORDER — LABETALOL HCL 100 MG PO TABS
100.0000 mg | ORAL_TABLET | Freq: Once | ORAL | Status: AC
  Administered 2014-11-09: 100 mg via ORAL
  Filled 2014-11-09: qty 1

## 2014-11-09 MED ORDER — LABETALOL HCL 200 MG PO TABS: 200.0000 mg | ORAL_TABLET | Freq: Two times a day (BID) | ORAL | Status: DC

## 2014-11-09 MED ORDER — SENNOSIDES-DOCUSATE SODIUM 8.6-50 MG PO TABS
2.0000 | ORAL_TABLET | ORAL | Status: DC
  Administered 2014-11-09: 2 via ORAL
  Filled 2014-11-09 (×2): qty 2

## 2014-11-09 MED ORDER — ACETAMINOPHEN 325 MG PO TABS
650.0000 mg | ORAL_TABLET | ORAL | Status: DC | PRN
  Administered 2014-11-10 – 2014-11-12 (×4): 650 mg via ORAL
  Filled 2014-11-09 (×4): qty 2

## 2014-11-09 MED ORDER — IBUPROFEN 600 MG PO TABS
600.0000 mg | ORAL_TABLET | Freq: Four times a day (QID) | ORAL | Status: DC
  Administered 2014-11-09: 600 mg via ORAL
  Filled 2014-11-09: qty 1

## 2014-11-09 MED ORDER — ACETAMINOPHEN 325 MG PO TABS: 650.0000 mg | ORAL_TABLET | ORAL | Status: DC | PRN

## 2014-11-09 MED ORDER — CITRIC ACID-SODIUM CITRATE 334-500 MG/5ML PO SOLN: 30.0000 mL | ORAL | Status: DC | PRN

## 2014-11-09 MED ORDER — TETANUS-DIPHTH-ACELL PERTUSSIS 5-2.5-18.5 LF-MCG/0.5 IM SUSP: 0.5000 mL | Freq: Once | INTRAMUSCULAR | Status: DC

## 2014-11-09 MED ORDER — OXYCODONE-ACETAMINOPHEN 5-325 MG PO TABS
1.0000 | ORAL_TABLET | ORAL | Status: DC | PRN
  Filled 2014-11-09: qty 1

## 2014-11-09 MED ORDER — BENZOCAINE-MENTHOL 20-0.5 % EX AERO: 1.0000 "application " | INHALATION_SPRAY | CUTANEOUS | Status: DC | PRN

## 2014-11-09 MED ORDER — LACTATED RINGERS IV SOLN: 500.0000 mL | INTRAVENOUS | Status: DC | PRN

## 2014-11-09 MED ORDER — LIDOCAINE HCL (PF) 1 % IJ SOLN
30.0000 mL | INTRAMUSCULAR | Status: DC | PRN
  Filled 2014-11-09: qty 30

## 2014-11-09 MED ORDER — FLEET ENEMA 7-19 GM/118ML RE ENEM: 1.0000 | ENEMA | RECTAL | Status: DC | PRN

## 2014-11-09 MED ORDER — OXYCODONE-ACETAMINOPHEN 5-325 MG PO TABS: 2.0000 | ORAL_TABLET | ORAL | Status: DC | PRN

## 2014-11-09 MED ORDER — OXYCODONE HCL 5 MG PO TABS
5.0000 mg | ORAL_TABLET | ORAL | Status: DC | PRN
  Administered 2014-11-09 – 2014-11-11 (×2): 5 mg via ORAL
  Filled 2014-11-09 (×2): qty 1

## 2014-11-09 MED ORDER — LANOLIN HYDROUS EX OINT: TOPICAL_OINTMENT | CUTANEOUS | Status: DC | PRN

## 2014-11-09 MED ORDER — LACTATED RINGERS IV SOLN
INTRAVENOUS | Status: DC
  Administered 2014-11-09: 18:00:00 via INTRAVENOUS

## 2014-11-09 MED ORDER — OXYCODONE-ACETAMINOPHEN 5-325 MG PO TABS
1.0000 | ORAL_TABLET | ORAL | Status: DC | PRN
  Administered 2014-11-09: 1 via ORAL

## 2014-11-09 MED ORDER — WITCH HAZEL-GLYCERIN EX PADS: 1.0000 "application " | MEDICATED_PAD | CUTANEOUS | Status: DC | PRN

## 2014-11-09 MED ORDER — ONDANSETRON HCL 4 MG/2ML IJ SOLN: 4.0000 mg | Freq: Four times a day (QID) | INTRAMUSCULAR | Status: DC | PRN

## 2014-11-09 NOTE — Lactation Note (Signed)
This note was copied from the chart of Diana Palmer Salser. Lactation Consultation Note    Intial consult with this mom of a NICU baby, now 4 hours old, and 33 4/[redacted] weeks gestation, weighing 4 lbs 7 oz, apgars 9 and 9. I assisted mom with pumping and ha d expresion for the first time, and she was able to express 1-2 ls of colostrum. I reviewed the NICU booklet with mom, and gave her 21 flanges, which was a god fit. Lactation services also reivewed. Skin to skin encouraged. Mom knows to call for questions/concers. Mom will need a 2 week rental of DEP  Patient Name: Diana Palmer Tofte ZOXWR'UToday's Date: 11/09/2014 Reason for consult: Initial assessment;NICU baby;Infant < 6lbs   Maternal Data Formula Feeding for Exclusion: No (baby in NICU) Has patient been taught Hand Expression?: Yes Does the patient have breastfeeding experience prior to this delivery?: No  Feeding    LATCH Score/Interventions                      Lactation Tools Discussed/Used WIC Program: No Pump Review: Setup, frequency, and cleaning;Milk Storage;Other (comment) (hand expression, premie setting) Initiated by:: clee rn lc Date initiated:: 11/09/14 (at 4 hours post partum)   Consult Status Consult Status: Follow-up Date: 11/10/14 Follow-up type: In-patient    Alfred LevinsLee, [place] Code Anne 11/09/2014, 4:41 PM

## 2014-11-09 NOTE — MAU Note (Signed)
Woke up @ 0530, went to BR, ? Leaking some fluid, which has continued.  Began having cramping, tried po hydration but still having cramping.  Pain became worse @ 0900.  Scant amount of bloody mucus on pad.

## 2014-11-09 NOTE — Progress Notes (Signed)
Patient now on 3rd floor for pp care. Baby stable in NICU.  Filed Vitals:   11/09/14 1212 11/09/14 1220 11/09/14 1309 11/09/14 1400  BP: 129/95 128/90  158/96  Pulse: 104 102 64 64  Temp:  97.8 F (36.6 C) 98.5 F (36.9 C) 98.8 F (37.1 C)  TempSrc:  Oral Oral Oral  Resp:  SpO2:   100% 100%   Results for orders placed or performed during the hospital encounter of 11/09/14 (from the past 24 hour(s))  Type and screen     Status: None   Collection Time: 11/09/14 11:28 AM  Result Value Ref Range   ABO/RH(D) A POS    Antibody Screen NEG    Sample Expiration 11/12/2014   CBC     Status: Abnormal   Collection Time: 11/09/14 11:28 AM  Result Value Ref Range   WBC 16.4 (H) 4.0 - 10.5 K/uL   RBC 3.57 (L) 3.87 - 5.11 MIL/uL   Hemoglobin 11.8 (L) 12.0 - 15.0 g/dL   HCT 78.4 (L) 69.6 - 29.5 %   MCV 94.1 78.0 - 100.0 fL   MCH 33.1 26.0 - 34.0 pg   MCHC 35.1 30.0 - 36.0 g/dL   RDW 28.4 13.2 - 44.0 %   Platelets 212 150 - 400 K/uL  Comprehensive metabolic panel     Status: Abnormal   Collection Time: 11/09/14 11:28 AM  Result Value Ref Range   Sodium 131 (L) 135 - 145 mmol/L   Potassium 3.5 3.5 - 5.1 mmol/L   Chloride 106 96 - 112 mmol/L   CO2 14 (L) 19 - 32 mmol/L   Glucose, Bld 101 (H) 70 - 99 mg/dL   BUN 14 6 - 23 mg/dL   Creatinine, Ser 1.02 0.50 - 1.10 mg/dL   Calcium 8.6 8.4 - 72.5 mg/dL   Total Protein 5.7 (L) 6.0 - 8.3 g/dL   Albumin 2.8 (L) 3.5 - 5.2 g/dL   AST 32 0 - 37 U/L   ALT 12 0 - 35 U/L   Alkaline Phosphatase 108 39 - 117 U/L   Total Bilirubin 0.5 0.3 - 1.2 mg/dL   GFR calc non Af Amer 89 (L) >90 mL/min   GFR calc Af Amer >90 >90 mL/min   Anion gap 11 5 - 15  Lactate dehydrogenase     Status: None   Collection Time: 11/09/14 11:28 AM  Result Value Ref Range   LDH 171 94 - 250 U/L  Uric acid     Status: None   Collection Time: 11/09/14 11:28 AM  Result Value Ref Range   Uric Acid, Serum 7.0 2.4 - 7.0 mg/dL  ABO/Rh     Status: None   Collection  Time: 11/09/14 11:30 AM  Result Value Ref Range   ABO/RH(D) A POS   Urinalysis, Routine w reflex microscopic     Status: Abnormal   Collection Time: 11/09/14 11:40 AM  Result Value Ref Range   Color, Urine YELLOW YELLOW   APPearance CLEAR CLEAR   Specific Gravity, Urine <1.005 (L) 1.005 - 1.030   pH 6.0 5.0 - 8.0   Glucose, UA NEGATIVE NEGATIVE mg/dL   Hgb urine dipstick LARGE (A) NEGATIVE   Bilirubin Urine NEGATIVE NEGATIVE   Ketones, ur NEGATIVE NEGATIVE mg/dL   Protein, ur NEGATIVE NEGATIVE mg/dL   Urobilinogen, UA 0.2 0.0 - 1.0 mg/dL   Nitrite NEGATIVE NEGATIVE   Leukocytes, UA NEGATIVE NEGATIVE  Protein / creatinine ratio, urine  Status: Abnormal   Collection Time: 11/09/14 11:40 AM  Result Value Ref Range   Creatinine, Urine 74.00 mg/dL   Total Protein, Urine 18 mg/dL   Protein Creatinine Ratio 0.24 (H) 0.00 - 0.15  Urine microscopic-add on     Status: None   Collection Time: 11/09/14 11:40 AM  Result Value Ref Range   Squamous Epithelial / LPF RARE RARE   RBC / HPF 0-2 <3 RBC/hpf   Consulted with Dr. Sallye OberKulwa. Will continue to observe BP at present.  If remains elevated, will consult plan of care (may need antihypertensives).  Nigel BridgemanVicki Cristiana Yochim, CNM 11/09/14 2:30p

## 2014-11-09 NOTE — Progress Notes (Addendum)
Filed Vitals:   11/09/14 1400 11/09/14 1500 11/09/14 1748 11/09/14 1753  BP: 158/96  158/100 158/95  Pulse: 64  78 59  Temp: 98.8 F (37.1 C)  98.2 F (36.8 C)   TempSrc: Oral  Oral   Resp: 18  18   Height:  5' (1.524 m)    Weight:  127 lb 6.4 oz (57.788 kg)    SpO2: 100%      Consulted with Dr. Sallye OberKulwa. Will start Labetalol 100 mg po BID. Stop ibuprophen, d/c Percocet. Start Tylenol for mild pain, Oxycodone for moderate pain.  Nigel BridgemanVicki Geno Sydnor, CNM 11/09/14 5:55p

## 2014-11-09 NOTE — Progress Notes (Signed)
Unable to void at this time for urine sample.
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
## 2014-11-10 LAB — CBC
HCT: 26.9 % — ABNORMAL LOW (ref 36.0–46.0)
Hemoglobin: 9.6 g/dL — ABNORMAL LOW (ref 12.0–15.0)
MCH: 33.3 pg (ref 26.0–34.0)
MCHC: 35.7 g/dL (ref 30.0–36.0)
MCV: 93.4 fL (ref 78.0–100.0)
Platelets: 158 10*3/uL (ref 150–400)
RBC: 2.88 MIL/uL — AB (ref 3.87–5.11)
RDW: 12.5 % (ref 11.5–15.5)
WBC: 15.2 10*3/uL — ABNORMAL HIGH (ref 4.0–10.5)

## 2014-11-10 LAB — RPR: RPR: NONREACTIVE

## 2014-11-10 MED ORDER — HYDRALAZINE HCL 20 MG/ML IJ SOLN: 10.0000 mg | Freq: Once | INTRAMUSCULAR | Status: AC | PRN

## 2014-11-10 MED ORDER — LABETALOL HCL 200 MG PO TABS
200.0000 mg | ORAL_TABLET | Freq: Two times a day (BID) | ORAL | Status: DC
  Administered 2014-11-10 (×2): 200 mg via ORAL
  Filled 2014-11-10: qty 1

## 2014-11-10 MED ORDER — LABETALOL HCL 5 MG/ML IV SOLN
20.0000 mg | INTRAVENOUS | Status: AC | PRN
  Administered 2014-11-11 (×2): 20 mg via INTRAVENOUS
  Administered 2014-11-11: 40 mg via INTRAVENOUS
  Filled 2014-11-10: qty 4
  Filled 2014-11-10: qty 8
  Filled 2014-11-10: qty 4

## 2014-11-10 MED ORDER — FERROUS SULFATE 325 (65 FE) MG PO TABS
325.0000 mg | ORAL_TABLET | Freq: Every day | ORAL | Status: DC
  Administered 2014-11-10 – 2014-11-12 (×3): 325 mg via ORAL
  Filled 2014-11-10 (×3): qty 1

## 2014-11-10 MED ORDER — MEASLES, MUMPS & RUBELLA VAC ~~LOC~~ INJ
0.5000 mL | INJECTION | Freq: Once | SUBCUTANEOUS | Status: AC
  Administered 2014-11-11: 0.5 mL via SUBCUTANEOUS
  Filled 2014-11-10: qty 0.5

## 2014-11-10 NOTE — Progress Notes (Addendum)
Subjective: Postpartum Day 1: Vaginal delivery, bilateral periurethral lacerations, no repair required Patient up ad lib, reports no syncope or dizziness.  Denies HA,visual sx, or epigastric pain Baby stable in NICU--patient on way to visit. Feeding:  Plans breastfeeding Contraceptive plan:  Undecided  Objective: Vital signs in last 24 hours: Temp:  [97.7 F (36.5 C)-98.8 F (37.1 C)] 98.3 F (36.8 C) (02/06 0556) Pulse Rate:  [56-104] 59 (02/06 0641) Resp:  [16-24] 18 (02/06 0556) BP: (128-173)/(85-101) 165/96 mmHg (02/06 0641) SpO2:  [100 %] 100 % (02/06 0556) Weight:  [127 lb 6.4 oz (57.788 kg)] 127 lb 6.4 oz (57.788 kg) (02/05 1500)   Filed Vitals:   11/09/14 2229 11/09/14 2300 11/10/14 0556 11/10/14 0641  BP: 173/97 165/96 167/93 165/96  Pulse:  61 57 59  Temp:   98.3 F (36.8 C)   TempSrc:   Oral   Resp:   18   Height:      Weight:      SpO2:   100%    Received Labetalol 100 mg po at 1853 and 2300  Physical Exam:  General: alert Lochia: appropriate Uterine Fundus: firm Perineum: healing well DVT Evaluation: No evidence of DVT seen on physical exam. Negative Homan's sign. DTR 1+, no clonus, no edema    Recent Labs  11/09/14 1128 11/10/14 0507  HGB 11.8* 9.6*  HCT 33.6* 26.9*    Assessment/Plan: Status post vaginal delivery day 1. Gestational hypertension Mild anemia  Labetalol now 200 mg po BID per Dr. Su Hiltoberts last order--will give am dose now Implementation of hypertension order set for establishment of BP parameters and IV Labetalol prn those parameters. Support to patient for NICU infant. FeSO4 q day  Nyra CapesLATHAM, VICKICNM 11/10/2014, 7:46 AM

## 2014-11-10 NOTE — Progress Notes (Signed)
Noted BPs to be 165-173/93-97 since 2156 PM last night. Shared this with oncoming CNM. Pt received 100 mg Labetalol last evening and has orders to start 200 mg bid at 10 am today.   Sherre ScarletKimberly Benigna Delisi, CNM 11/10/14 @ 07:24 AM

## 2014-11-11 LAB — PROTEIN / CREATININE RATIO, URINE
Creatinine, Urine: 17 mg/dL
Total Protein, Urine: <6 mg/dL

## 2014-11-11 LAB — CBC
HCT: 27.5 % — ABNORMAL LOW (ref 36.0–46.0)
Hemoglobin: 9.6 g/dL — ABNORMAL LOW (ref 12.0–15.0)
MCH: 33.1 pg (ref 26.0–34.0)
MCHC: 34.9 g/dL (ref 30.0–36.0)
MCV: 94.8 fL (ref 78.0–100.0)
Platelets: 167 10*3/uL (ref 150–400)
RBC: 2.9 MIL/uL — AB (ref 3.87–5.11)
RDW: 12.8 % (ref 11.5–15.5)
WBC: 15.6 10*3/uL — AB (ref 4.0–10.5)

## 2014-11-11 LAB — COMPREHENSIVE METABOLIC PANEL
ALT: 17 U/L (ref 0–35)
ANION GAP: 7 (ref 5–15)
AST: 29 U/L (ref 0–37)
Albumin: 2.6 g/dL — ABNORMAL LOW (ref 3.5–5.2)
Alkaline Phosphatase: 77 U/L (ref 39–117)
BUN: 8 mg/dL (ref 6–23)
CO2: 24 mmol/L (ref 19–32)
Calcium: 8.4 mg/dL (ref 8.4–10.5)
Chloride: 110 mmol/L (ref 96–112)
Creatinine, Ser: 0.64 mg/dL (ref 0.50–1.10)
Glucose, Bld: 90 mg/dL (ref 70–99)
Potassium: 4 mmol/L (ref 3.5–5.1)
SODIUM: 141 mmol/L (ref 135–145)
TOTAL PROTEIN: 5.3 g/dL — AB (ref 6.0–8.3)
Total Bilirubin: <0.1 mg/dL — ABNORMAL LOW (ref 0.3–1.2)

## 2014-11-11 LAB — LACTATE DEHYDROGENASE: LDH: 211 U/L (ref 94–250)

## 2014-11-11 LAB — URIC ACID: URIC ACID, SERUM: 5.3 mg/dL (ref 2.4–7.0)

## 2014-11-11 MED ORDER — LABETALOL HCL 300 MG PO TABS
300.0000 mg | ORAL_TABLET | Freq: Two times a day (BID) | ORAL | Status: DC
  Filled 2014-11-11: qty 1

## 2014-11-11 MED ORDER — HYDRALAZINE HCL 20 MG/ML IJ SOLN
10.0000 mg | INTRAMUSCULAR | Status: DC | PRN
  Administered 2014-11-11: 10 mg via INTRAVENOUS
  Filled 2014-11-11: qty 1

## 2014-11-11 MED ORDER — LABETALOL HCL 200 MG PO TABS: 300.0000 mg | ORAL_TABLET | Freq: Two times a day (BID) | ORAL | Status: DC

## 2014-11-11 MED ORDER — NIFEDIPINE ER 30 MG PO TB24
30.0000 mg | ORAL_TABLET | Freq: Every day | ORAL | Status: DC
  Administered 2014-11-11: 30 mg via ORAL
  Filled 2014-11-11 (×2): qty 1

## 2014-11-11 MED ORDER — LABETALOL HCL 5 MG/ML IV SOLN
20.0000 mg | INTRAVENOUS | Status: DC | PRN
  Administered 2014-11-11: 80 mg via INTRAVENOUS

## 2014-11-11 NOTE — Progress Notes (Signed)
Bp 160/93.Patient resting denies H/A,blurred vision,RUQ pain.Will give IV Labetalol 20 mg IV push.

## 2014-11-11 NOTE — Progress Notes (Signed)
Called Diana ScarletKimberly Palmer for [facility] to notify patient bp 173/111.Patient denies Blurred vision and RUQ pain but does have a headache.DTR's are WNL.IV site had to be restarted and Hydralazine 10 mg slow push given and 15 minute  bp repeat was 138/87 at 2315.

## 2014-11-11 NOTE — Progress Notes (Addendum)
Subjective: Postpartum Day 2: Vaginal delivery at 33 2/7 weeks, bilateral periurethral lacerations Patient up ad lib, reports no syncope or dizziness.  Denies HA, visual sx, or epigastric pain. Feeding:  Plans breast--is pumping for NICU infant Contraceptive plan:  Undecided Baby stable in NICU   BP noted to be elevated on admission, with no previous hx of hypertension.  PIH labs WNL, with PCR 0.24.  BP remained elevated on day 0, with initiation of Labetalol 100 mg po BID.  Ibuprophen d/c'd. Labetalol increased to 200 mg BID on day 1, due to persistent HTN, with implementation of IV Labetalol prn for BP parameters.  Through the early am of 2/7, BP remained elevated, with patient requiring 4 doses IV labetalol.  Initial plan to increase Labetalol to 300 mg BID today, but with requirement of up to 80 mg labetalol with no BP response, re-evaluation for pre-eclampsia is to be undertaken this am.   Objective: Vital signs in last 24 hours: Temp:  [97.8 F (36.6 C)-98.7 F (37.1 C)] 98.6 F (37 C) (02/07 0615) Pulse Rate:  [64-82] 64 (02/07 0615) Resp:  [18] 18 (02/07 0615) BP: (138-174)/(86-105) 174/105 mmHg (02/07 0758) SpO2:  [99 %-100 %] 100 % (02/07 0615) Weight:  [112 lb (50.803 kg)] 112 lb (50.803 kg) (02/07 0615)   Filed Vitals:   11/11/14 0615 11/11/14 0640 11/11/14 0710 11/11/14 0758  BP: 167/96 173/100 173/97 174/105  Pulse: 64     Temp: 98.6 F (37 C)     TempSrc: Oral     Resp: 18     Height:      Weight: 112 lb (50.803 kg)     SpO2: 100%      Received series of Labetalol doses during night, with no appreciable change in BP. 0139--20 mg 0628--20 mg 0646--40 mg 0738--80 mg  Plan today to increase Labetalol to 300 mg BID today.   Physical Exam:  General: alert Lochia: appropriate Uterine Fundus: firm Perineum: healing well DVT Evaluation: No evidence of DVT seen on physical exam. Negative Homan's sign. DTR 2+, no clonus    Recent Labs  11/09/14 1128  11/10/14 0507  HGB 11.8* 9.6*  HCT 33.6* 26.9*    Assessment/Plan: PP day 2 Persistently elevated BP pp--gestational hypertension vs pre-eclampsia  Plan: Consulted with Dr. Su Hiltoberts D/c labetalol Rx Procardia 30 mg XL now PIH labs with PCR by cath Will consider magnesium based on labs and BP response.   Diana Palmer, VICKICNM 11/11/2014, 8:08 AM   Addendum: Results for orders placed or performed during the hospital encounter of 11/09/14 (from the past 24 hour(s))  CBC     Status: Abnormal   Collection Time: 11/11/14  8:17 AM  Result Value Ref Range   WBC 15.6 (H) 4.0 - 10.5 K/uL   RBC 2.90 (L) 3.87 - 5.11 MIL/uL   Hemoglobin 9.6 (L) 12.0 - 15.0 g/dL   HCT 65.727.5 (L) 84.636.0 - 96.246.0 %   MCV 94.8 78.0 - 100.0 fL   MCH 33.1 26.0 - 34.0 pg   MCHC 34.9 30.0 - 36.0 g/dL   RDW 95.212.8 84.111.5 - 32.415.5 %   Platelets 167 150 - 400 K/uL  Comprehensive metabolic panel     Status: Abnormal   Collection Time: 11/11/14  8:17 AM  Result Value Ref Range   Sodium 141 135 - 145 mmol/L   Potassium 4.0 3.5 - 5.1 mmol/L   Chloride 110 96 - 112 mmol/L   CO2 24 19 - 32 mmol/L   Glucose,  Bld 90 70 - 99 mg/dL   BUN 8 6 - 23 mg/dL   Creatinine, Ser 1.61 0.50 - 1.10 mg/dL   Calcium 8.4 8.4 - 09.6 mg/dL   Total Protein 5.3 (L) 6.0 - 8.3 g/dL   Albumin 2.6 (L) 3.5 - 5.2 g/dL   AST 29 0 - 37 U/L   ALT 17 0 - 35 U/L   Alkaline Phosphatase 77 39 - 117 U/L   Total Bilirubin <0.1 (L) 0.3 - 1.2 mg/dL   GFR calc non Af Amer >90 >90 mL/min   GFR calc Af Amer >90 >90 mL/min   Anion gap 7 5 - 15  Lactate dehydrogenase     Status: None   Collection Time: 11/11/14  8:17 AM  Result Value Ref Range   LDH 211 94 - 250 U/L  Uric acid     Status: None   Collection Time: 11/11/14  8:17 AM  Result Value Ref Range   Uric Acid, Serum 5.3 2.4 - 7.0 mg/dL  Protein / creatinine ratio, urine     Status: None   Collection Time: 11/11/14  8:40 AM  Result Value Ref Range   Creatinine, Urine 17.00 mg/dL   Total Protein, Urine <6  mg/dL   Protein Creatinine Ratio        0.00 - 0.15   Due to low total protein, anticipate PCR to be WNL. Dr. Su Hilt updated. Will continue to observe BP, defer magnesium at present.  Nigel Bridgeman, CNM 11/11/14 10:45a

## 2014-11-11 NOTE — Progress Notes (Signed)
Patient's 0615 bp 167/96.Denies any H/A,blurred vision.or RUQ pain, given 20 mg of Labetalol IV per protocol.Repeat bp at 0640 was 173/100.40mg  of Labetalol IV push given per protocol.Repeat bp at 0710 173/97.Dr Sallye OberKulwa notified of patient status and wants me to proceed with 80 mg of Labetalol IV per protocol that was done will repeat bp and notify MD.

## 2014-11-11 NOTE — Progress Notes (Signed)
At 0145 started giving Labetalol 20 mg IV slow push as ordered.Repeat bp at 0206 was 157/91.Patient continues to deny any c/o.

## 2014-11-11 NOTE — Progress Notes (Addendum)
TC from RN re: BP 173/111. Labetalol d/c'd and pt was started on Procardia XL this morning. Discussed w/ Dr. Sallye OberKulwa. Will proceed w/ Hydralazine parameters.  Sherre ScarletKimberly Honorio Devol, CNM 11/11/2014, 10:08 PM

## 2014-11-11 NOTE — Progress Notes (Signed)
Reviewed overnight BPs w/ Dr. Sallye OberKulwa - [phone]/86-96. Labetalol increased to 300 mg po bid beginning at 0800 today.   Sherre ScarletKimberly Jeromey Kruer, CNM 11/11/2014, 06:47 AM

## 2014-11-12 MED ORDER — OXYCODONE HCL 5 MG PO TABS: 5.0000 mg | ORAL_TABLET | ORAL | Status: DC | PRN

## 2014-11-12 MED ORDER — NIFEDIPINE ER 60 MG PO TB24: 60.0000 mg | ORAL_TABLET | Freq: Every day | ORAL | Status: DC

## 2014-11-12 MED ORDER — FERROUS SULFATE 325 (65 FE) MG PO TABS: 325.0000 mg | ORAL_TABLET | Freq: Every day | ORAL | Status: DC

## 2014-11-12 MED ORDER — NIFEDIPINE ER 60 MG PO TB24
60.0000 mg | ORAL_TABLET | Freq: Every day | ORAL | Status: DC
  Administered 2014-11-12: 60 mg via ORAL
  Filled 2014-11-12 (×2): qty 1

## 2014-11-12 NOTE — Discharge Summary (Signed)
Vaginal Delivery Discharge Summary  Pt request DC to [facility]  DOB:    06-27-1986 MRN:    [id number] CSN:    [id number]  Date of admission:                  11/09/14  Date of discharge:                   11/12/14  Procedures this admission: SVD  Date of Delivery: 11/09/14  Newborn Data:  Live born  Information for the patient's newborn:  Diana Palmer, Diana Palmer [[id number]]  female  Live born female  Birth Weight: 4 lb 3.7 oz (1920 g) APGAR: 9, 9   Baby to remain in the NICU Name: Chase Circumcision Plan: IP  History of Present Illness: [name] is a 29 y.o. female, [id number], who presents at 30w4dweeks gestation. [facility] She was admitted onset of labor. Her pregnancy has been complicated by:  Patient Active Problem List   Diagnosis Date Noted  . Vaginal delivery 11/09/2014  . Rubella non-immune status, antepartum 11/09/2014  . Preterm delivery 11/09/2014  . Gestational hypertension 11/09/2014  . Concentration deficit 07/08/2013  . Diarrhea 07/08/2013  . Cervical dysplasia   . Elevated blood pressure reading without diagnosis of hypertension     Hospital course: The patient was admitted for PTL.   Her labor was complicated, PTL. She proceeded to have a vaginal delivery of a healthy infant. Her delivery was not complicated. Her postpartum course was not complicated. She was discharged to home on postpartum day 3 doing well.  Feeding: Breast pumping  Contraception: unsure Pt understands the risks birth control are not limited to irregular bleeding, formation of DVT, fluid fluctuations, elevation in blood pressure, stroke, breast tenderness and liver damage.  She states she will report any serious side effects.  She has been given verbal and written instructions and voiced a clear understanding.    Discharge hemoglobin: HEMOGLOBIN  Date  Value Ref Range Status  11/11/2014 9.6* 12.0 - 15.0 g/dL Final   HCT  Date Value Ref Range Status  11/11/2014 27.5* 36.0 - 46.0 % Final    PreNatal Labs ABO, Rh: --/--/A POS (02/05 1130)   Antibody: NEG (02/05 1128) Rubella:   non immune  MMR given 11/11/14 RPR: Non Reactive (02/05 1128)  HBsAg:   neg HIV:   NR GBS:  unknown  Discharge Physical Exam:  General: alert and cooperative Lochia: appropriate Uterine Fundus: firm Incision: healing well DVT Evaluation: No evidence of DVT seen on physical exam.  Intrapartum Procedures: spontaneous vaginal delivery Postpartum Procedures: Rubella Ig Complications-Operative and Postpartum: Bilateral periurethrals, no repair required  Discharge Diagnoses: Premature labor & delivery, asymptomatic anemia, HTN  Activity:           pelvic rest Diet:                routine Medications: PNV, Iron and procardia Condition:      stable     Postpartum Teaching: Nutrition, exercise, return to work or school, family visits, sexual activity, home rest, vaginal bleeding, pelvic rest, family planning, s/s of PPD, breast care peri-care and incision care  PIH precautions given  Discharge to: home  [facility] nurse to come on Wednesday for a BP check Pt to FU in the office in 1 week for BP check Pt to self check BP  at home.  Pt to call if SBP 160 or greater and/or DBP greater   Follow-up Information    Follow up with [facility]. Schedule an appointment as soon as possible for a visit in 1 week.   Specialty:  Obstetrics and Gynecology   Why:  Follow  up appointment.  Call with any questions or concerns, As needed   Contact information:   [address] [place] [address] [phone]       Farris Geiman, CNM, MSN 11/12/2014. 12:52 PM   Postpartum Care After Vaginal Delivery  After you deliver your newborn (postpartum period), the usual stay in the hospital is 24 72 hours. If  there were problems with your labor or delivery, or if you have other medical problems, you might be in the hospital longer.  While you are in the hospital, you will receive help and instructions on how to care for yourself and your newborn during the postpartum period.  While you are in the hospital:  Be sure to tell your nurses if you have pain or discomfort, as well as where you feel the pain and what makes the pain worse.  If you had an incision made near your vagina (episiotomy) or if you had some tearing during delivery, the nurses may put ice packs on your episiotomy or tear. The ice packs may help to reduce the pain and swelling.  If you are breastfeeding, you may feel uncomfortable contractions of your uterus for a couple of weeks. This is normal. The contractions help your uterus get back to normal size.  It is normal to have some bleeding after delivery.  For the first 1 3 days after delivery, the flow is red and the amount may be similar to a period.  It is common for the flow to start and stop.  In the first few days, you may pass some small clots. Let your nurses know if you begin to pass large clots or your flow increases.  Do not  flush blood clots down the toilet before having the nurse look at them.  During the next 3 10 days after delivery, your flow should become more watery and pink or brown-tinged in color.  Ten to fourteen days after delivery, your flow should be a small amount of yellowish-white discharge.  The amount of your flow will decrease over the first few weeks after delivery. Your flow may stop in 6 8 weeks. Most women have had their flow stop by 12 weeks after delivery.  You should change your sanitary pads frequently.  Wash your hands thoroughly with soap and water for at least 20 seconds after changing pads, using the toilet, or before holding or feeding your newborn.  You should feel like you need to empty your bladder within the first 6 8 hours  after delivery.  In case you become weak, lightheaded, or faint, call your nurse before you get out of bed for the first time and before you take a shower for the first time.  Within the first few days after delivery, your breasts may begin to feel tender and full. This is called engorgement. Breast tenderness usually goes away within 48 72 hours after engorgement occurs. You may also notice milk leaking from your breasts. If you are not breastfeeding, do not stimulate your breasts. Breast stimulation can make your breasts produce more milk.  Spending as much time as possible with your newborn is very important. During this time,  you and your newborn can feel close and get to know each other. Having your newborn stay in your room (rooming in) will help to strengthen the bond with your newborn. It will give you time to get to know your newborn and become comfortable caring for your newborn.  Your hormones change after delivery. Sometimes the hormone changes can temporarily cause you to feel sad or tearful. These feelings should not last more than a few days. If these feelings last longer than that, you should talk to your caregiver.  If desired, talk to your caregiver about methods of family planning or contraception.  Talk to your caregiver about immunizations. Your caregiver may want you to have the following immunizations before leaving the hospital:  Tetanus, diphtheria, and pertussis (Tdap) or tetanus and diphtheria (Td) immunization. It is very important that you and your family (including grandparents) or others caring for your newborn are up-to-date with the Tdap or Td immunizations. The Tdap or Td immunization can help protect your newborn from getting ill.  Rubella immunization.  Varicella (chickenpox) immunization.  Influenza immunization. You should receive this annual immunization if you did not receive the immunization during your pregnancy. Document Released: 07/19/2007 Document  Revised: 06/15/2012 Document Reviewed: 05/18/2012 [facility] Patient Information 2014 [place].   Postpartum Depression and Baby Blues  The postpartum period begins right after the birth of a baby. During this time, there is often a great amount of joy and excitement. It is also a time of considerable changes in the life of the parent(s). Regardless of how many times a mother gives birth, each child brings new challenges and dynamics to the family. It is not unusual to have feelings of excitement accompanied by confusing shifts in moods, emotions, and thoughts. All mothers are at risk of developing postpartum depression or the "baby blues." These mood changes can occur right after giving birth, or they may occur many months after giving birth. The baby blues or postpartum depression can be mild or severe. Additionally, postpartum depression can resolve rather quickly, or it can be a long-term condition. CAUSES Elevated hormones and their rapid decline are thought to be a main cause of postpartum depression and the baby blues. There are a number of hormones that radically change during and after pregnancy. Estrogen and progesterone usually decrease immediately after delivering your baby. The level of thyroid hormone and various cortisol steroids also rapidly drop. Other factors that play a major role in these changes include major life events and genetics.  RISK FACTORS If you have any of the following risks for the baby blues or postpartum depression, know what symptoms to watch out for during the postpartum period. Risk factors that may increase the likelihood of getting the baby blues or postpartum depression include: 1. Havinga personal or family history of depression. 2. Having depression while being pregnant. 3. Having premenstrual or oral contraceptive-associated mood issues. 4. Having exceptional life stress. 5. Having marital conflict. 6. Lacking a social support network. 7. Having a  baby with special needs. 8. Having health problems such as diabetes. SYMPTOMS Baby blues symptoms include:  Brief fluctuations in mood, such as going from extreme happiness to sadness.  Decreased concentration.  Difficulty sleeping.  Crying spells, tearfulness.  Irritability.  Anxiety. Postpartum depression symptoms typically begin within the first month after giving birth. These symptoms include:  Difficulty sleeping or excessive sleepiness.  Marked weight loss.  Agitation.  Feelings of worthlessness.  Lack of interest in activity or food. Postpartum psychosis is  a very concerning condition and can be dangerous. Fortunately, it is rare. Displaying any of the following symptoms is cause for immediate medical attention. Postpartum psychosis symptoms include:  Hallucinations and delusions.  Bizarre or disorganized behavior.  Confusion or disorientation. DIAGNOSIS  A diagnosis is made by an evaluation of your symptoms. There are no medical or lab tests that lead to a diagnosis, but there are various questionnaires that a caregiver may use to identify those with the baby blues, postpartum depression, or psychosis. Often times, a screening tool called the [place] Postnatal Depression Scale is used to diagnose depression in the postpartum period.  TREATMENT The baby blues usually goes away on its own in 1 to 2 weeks. Social support is often all that is needed. You should be encouraged to get adequate sleep and rest. Occasionally, you may be given medicines to help you sleep.  Postpartum depression requires treatment as it can last several months or longer if it is not treated. Treatment may include individual or group therapy, medicine, or both to address any social, physiological, and psychological factors that may play a role in the depression. Regular exercise, a healthy diet, rest, and social support may also be strongly recommended.  Postpartum psychosis is more serious and  needs treatment right away. Hospitalization is often needed. HOME CARE INSTRUCTIONS  Get as much rest as you can. Nap when the baby sleeps.  Exercise regularly. Some women find yoga and walking to be beneficial.  Eat a balanced and nourishing diet.  Do little things that you enjoy. Have a cup of tea, take a bubble bath, read your favorite magazine, or listen to your favorite music.  Avoid alcohol.  Ask for help with household chores, cooking, grocery shopping, or running errands as needed. Do not try to do everything.  Talk to people close to you about how you are feeling. Get support from your partner, family members, friends, or other new moms.  Try to stay positive in how you think. Think about the things you are grateful for.  Do not spend a lot of time alone.  Only take medicine as directed by your caregiver.  Keep all your postpartum appointments.  Let your caregiver know if you have any concerns. SEEK MEDICAL CARE IF: You are having a reaction or problems with your medicine. SEEK IMMEDIATE MEDICAL CARE IF:  You have suicidal feelings.  You feel you may harm the baby or someone else. Document Released: 06/25/2004 Document Revised: 12/14/2011 Document Reviewed: 07/28/2011 [facility] Patient Information 2014 [place], [place].     Breastfeeding Deciding to breastfeed is one of the best choices you can make for you and your baby. A change in hormones during pregnancy causes your breast tissue to grow and increases the number and size of your milk ducts. These hormones also allow proteins, sugars, and fats from your blood supply to make breast milk in your milk-producing glands. Hormones prevent breast milk from being released before your baby is born as well as prompt milk flow after birth. Once breastfeeding has begun, thoughts of your baby, as well as his or her sucking or crying, can stimulate the release of milk from your milk-producing glands.  BENEFITS OF  BREASTFEEDING For Your Baby  Your first milk (colostrum) helps your baby's digestive system function better.   There are antibodies in your milk that help your baby fight off infections.   Your baby has a lower incidence of asthma, allergies, and sudden infant death syndrome.   The nutrients  in breast milk are better for your baby than infant formulas and are designed uniquely for your baby's needs.   Breast milk improves your baby's brain development.   Your baby is less likely to develop other conditions, such as childhood obesity, asthma, or type 2 diabetes mellitus.  For You   Breastfeeding helps to create a very special bond between you and your baby.   Breastfeeding is convenient. Breast milk is always available at the correct temperature and costs nothing.   Breastfeeding helps to burn calories and helps you lose the weight gained during pregnancy.   Breastfeeding makes your uterus contract to its prepregnancy size faster and slows bleeding (lochia) after you give birth.   Breastfeeding helps to lower your risk of developing type 2 diabetes mellitus, osteoporosis, and breast or ovarian cancer later in life. SIGNS THAT YOUR BABY IS HUNGRY Early Signs of Hunger  Increased alertness or activity.  Stretching.  Movement of the head from side to side.  Movement of the head and opening of the mouth when the corner of the mouth or cheek is stroked (rooting).  Increased sucking sounds, smacking lips, cooing, sighing, or squeaking.  Hand-to-mouth movements.  Increased sucking of fingers or hands. Late Signs of Hunger  Fussing.  Intermittent crying. Extreme Signs of Hunger Signs of extreme hunger will require calming and consoling before your baby will be able to breastfeed successfully. Do not wait for the following signs of extreme hunger to occur before you initiate breastfeeding:   Restlessness.  A loud, strong cry.   Screaming.   BREASTFEEDING  BASICS Breastfeeding Initiation  Find a comfortable place to sit or lie down, with your neck and back well supported.  Place a pillow or rolled up blanket under your baby to bring him or her to the level of your breast (if you are seated). Nursing pillows are specially designed to help support your arms and your baby while you breastfeed.  Make sure that your baby's abdomen is facing your abdomen.   Gently massage your breast. With your fingertips, massage from your chest wall toward your nipple in a circular motion. This encourages milk flow. You may need to continue this action during the feeding if your milk flows slowly.  Support your breast with 4 fingers underneath and your thumb above your nipple. Make sure your fingers are well away from your nipple and your baby's mouth.   Stroke your baby's lips gently with your finger or nipple.   When your baby's mouth is open wide enough, quickly bring your baby to your breast, placing your entire nipple and as much of the colored area around your nipple (areola) as possible into your baby's mouth.   More areola should be visible above your baby's upper lip than below the lower lip.   Your baby's tongue should be between his or her lower gum and your breast.   Ensure that your baby's mouth is correctly positioned around your nipple (latched). Your baby's lips should create a seal on your breast and be turned out (everted).  It is common for your baby to suck about 2-3 minutes in order to start the flow of breast milk. Latching Teaching your baby how to latch on to your breast properly is very important. An improper latch can cause nipple pain and decreased milk supply for you and poor weight gain in your baby. Also, if your baby is not latched onto your nipple properly, he or she may swallow some air during  feeding. This can make your baby fussy. Burping your baby when you switch breasts during the feeding can help to get rid of the air.  However, teaching your baby to latch on properly is still the best way to prevent fussiness from swallowing air while breastfeeding. Signs that your baby has successfully latched on to your nipple:    Silent tugging or silent sucking, without causing you pain.   Swallowing heard between every 3-4 sucks.    Muscle movement above and in front of his or her ears while sucking.  Signs that your baby has not successfully latched on to nipple:   Sucking sounds or smacking sounds from your baby while breastfeeding.  Nipple pain. If you think your baby has not latched on correctly, slip your finger into the corner of your baby's mouth to break the suction and place it between your baby's gums. Attempt breastfeeding initiation again. Signs of Successful Breastfeeding Signs from your baby:   A gradual decrease in the number of sucks or complete cessation of sucking.   Falling asleep.   Relaxation of his or her body.   Retention of a small amount of milk in his or her mouth.   Letting go of your breast by himself or herself. Signs from you:  Breasts that have increased in firmness, weight, and size 1-3 hours after feeding.   Breasts that are softer immediately after breastfeeding.  Increased milk volume, as well as a change in milk consistency and color by the fifth day of breastfeeding.   Nipples that are not sore, cracked, or bleeding. Signs That Your [employer]el Books is Getting Enough Milk  Wetting at least 3 diapers in a 24-hour period. The urine should be clear and pale yellow by age 19 days.  At least 3 stools in a 24-hour period by age 19 days. The stool should be soft and yellow.  At least 3 stools in a 24-hour period by age 34 days. The stool should be seedy and yellow.  No loss of weight greater than 10% of birth weight during the first 60 days of age.  Average weight gain of 4-7 ounces (113-198 g) per week after age 76 days.  Consistent daily weight gain by age 22 days,  without weight loss after the age of 2 weeks. After a feeding, your baby may spit up a small amount. This is common. BREASTFEEDING FREQUENCY AND DURATION Frequent feeding will help you make more milk and can prevent sore nipples and breast engorgement. Breastfeed when you feel the need to reduce the fullness of your breasts or when your baby shows signs of hunger. This is called "breastfeeding on demand." Avoid introducing a pacifier to your baby while you are working to establish breastfeeding (the first 4-6 weeks after your baby is born). After this time you may choose to use a pacifier. Research has shown that pacifier use during the first year of a baby's life decreases the risk of sudden infant death syndrome (SIDS). Allow your baby to feed on each breast as long as he or she wants. Breastfeed until your baby is finished feeding. When your baby unlatches or falls asleep while feeding from the first breast, offer the second breast. Because newborns are often sleepy in the first few weeks of life, you may need to awaken your baby to get him or her to feed. Breastfeeding times will vary from baby to baby. However, the following rules can serve as a guide to help you ensure that your baby  is properly fed:  Newborns (babies 24 weeks of age or younger) may breastfeed every 1-3 hours.  Newborns should not go longer than 3 hours during the day or 5 hours during the night without breastfeeding.  You should breastfeed your baby a minimum of 8 times in a 24-hour period until you begin to introduce solid foods to your baby at around 73 months of age. BREAST MILK PUMPING Pumping and storing breast milk allows you to ensure that your baby is exclusively fed your breast milk, even at times when you are unable to breastfeed. This is especially important if you are going back to work while you are still breastfeeding or when you are not able to be present during feedings. Your lactation consultant can give you  guidelines on how long it is safe to store breast milk.  A breast pump is a machine that allows you to pump milk from your breast into a sterile bottle. The pumped breast milk can then be stored in a refrigerator or freezer. Some breast pumps are operated by hand, while others use electricity. Ask your lactation consultant which type will work best for you. Breast pumps can be purchased, but some hospitals and breastfeeding support groups lease breast pumps on a monthly basis. A lactation consultant can teach you how to hand express breast milk, if you prefer not to use a pump.  CARING FOR YOUR BREASTS WHILE YOU BREASTFEED Nipples can become dry, cracked, and sore while breastfeeding. The following recommendations can help keep your breasts moisturized and healthy:  Avoid using soap on your nipples.   Wear a supportive bra. Although not required, special nursing bras and tank tops are designed to allow access to your breasts for breastfeeding without taking off your entire bra or top. Avoid wearing underwire-style bras or extremely tight bras.  Air dry your nipples for 3-61mnutes after each feeding.   Use only cotton bra pads to absorb leaked breast milk. Leaking of breast milk between feedings is normal.   Use lanolin on your nipples after breastfeeding. Lanolin helps to maintain your skin's normal moisture barrier. If you use pure lanolin, you do not need to wash it off before feeding your baby again. Pure lanolin is not toxic to your baby. You may also hand express a few drops of breast milk and gently massage that milk into your nipples and allow the milk to air dry. In the first few weeks after giving birth, some women experience extremely full breasts (engorgement). Engorgement can make your breasts feel heavy, warm, and tender to the touch. Engorgement peaks within 3-5 days after you give birth. The following recommendations can help ease engorgement:  Completely empty your breasts while  breastfeeding or pumping. You may want to start by applying warm, moist heat (in the shower or with warm water-soaked hand towels) just before feeding or pumping. This increases circulation and helps the milk flow. If your baby does not completely empty your breasts while breastfeeding, pump any extra milk after he or she is finished.  Wear a snug bra (nursing or regular) or tank top for 1-2 days to signal your body to slightly decrease milk production.  Apply ice packs to your breasts, unless this is too uncomfortable for you.  Make sure that your baby is latched on and positioned properly while breastfeeding. If engorgement persists after 48 hours of following these recommendations, contact your health care provider or a lScience writer OVERALL HEALTH CARE RECOMMENDATIONS WHILE BREASTFEEDING  Eat healthy foods.  Alternate between meals and snacks, eating 3 of each per day. Because what you eat affects your breast milk, some of the foods may make your baby more irritable than usual. Avoid eating these foods if you are sure that they are negatively affecting your baby.  Drink milk, fruit juice, and water to satisfy your thirst (about 10 glasses a day).   Rest often, relax, and continue to take your prenatal vitamins to prevent fatigue, stress, and anemia.  Continue breast self-awareness checks.  Avoid chewing and smoking tobacco.  Avoid alcohol and drug use. Some medicines that may be harmful to your baby can pass through breast milk. It is important to ask your health care provider before taking any medicine, including all over-the-counter and prescription medicine as well as vitamin and herbal supplements. It is possible to become pregnant while breastfeeding. If birth control is desired, ask your health care provider about options that will be safe for your baby. SEEK MEDICAL CARE IF:   You feel like you want to stop breastfeeding or have become frustrated with breastfeeding.  You  have painful breasts or nipples.  Your nipples are cracked or bleeding.  Your breasts are red, tender, or warm.  You have a swollen area on either breast.  You have a fever or chills.  You have nausea or vomiting.  You have drainage other than breast milk from your nipples.  Your breasts do not become full before feedings by the fifth day after you give birth.  You feel sad and depressed.  Your baby is too sleepy to eat well.  Your baby is having trouble sleeping.   Your baby is wetting less than 3 diapers in a 24-hour period.  Your baby has less than 3 stools in a 24-hour period.  Your baby's skin or the white part of his or her eyes becomes yellow.   Your baby is not gaining weight by 74 days of age. SEEK IMMEDIATE MEDICAL CARE IF:   Your baby is overly tired (lethargic) and does not want to wake up and feed.  Your baby develops an unexplained fever. Document Released: 09/21/2005 Document Revised: 09/26/2013 Document Reviewed: 03/15/2013 [facility] Patient Information 2015 [place], [place]. This information is not intended to replace advice given to you by your health care provider. Make sure you discuss any questions you have with your health care provider.

## 2014-11-12 NOTE — Progress Notes (Signed)
Spoke to Glennon Macheresa Tollinson, RN at [facility] [phone]) regarding request for [facility] [employer] RN to check patient's blood pressure on Wednesday at home.  Aggie Cosierheresa said she would make arrangements for patient to be seen.

## 2014-11-12 NOTE — Lactation Note (Signed)
This note was copied from the chart of Diana Palmer. Lactation Consultation Note  Follow up visit made.  Mom's breasts are very full this AM.  She is pumping every 3 hours and obtaining about 30 mls.  Recommended she increase pumping to every 2 hours throughout today to prevent engorgement.  Instructed to ask RN for ice packs if breasts become firm and painful.  Mom will be discharged most likely today or tomorrow.  Her blood pressure has been elevated requiring IV meds.  She plans on purchasing a Medela DEBP for home use.  Mom does plan on putting baby to breast when he is ready.   Encouraged to call with concerns prn.  Patient Name: Diana Palmer AVWUJ'WToday's Date: 11/12/2014     Maternal Data    Feeding Feeding Type: Breast Milk Length of feed: 20 min  LATCH Score/Interventions                      Lactation Tools Discussed/Used     Consult Status      Huston FoleyMOULDEN, Narcisa Ganesh S 11/12/2014, 11:38 AM

## 2014-11-12 NOTE — Progress Notes (Signed)
Ur chart review completed.  

## 2014-11-12 NOTE — Progress Notes (Signed)
Clinical Social Work Department PSYCHOSOCIAL ASSESSMENT - MATERNAL/CHILD 11/12/2014  Patient:  Raptis,Joylyn BEST  Account Number:  [id number]  Admit Date:  11/09/2014  Childs Name:   Chase Locastro    Clinical Social Worker:  Soniya Ashraf, LCSW   Date/Time:  11/12/2014 10:30 AM  Date Referred:        Other referral source:   No referral-NICU admission    I:  FAMILY / HOME ENVIRONMENT Child's legal guardian:  PARENT  Guardian - Name Guardian - Age Guardian - Address  Rolene Orebaugh [address] [place], [place] [address]  Cory Hofacker  same   Other household support members/support persons Other support:   Parents report that they have a great support system of family and friends in the area    II  PSYCHOSOCIAL DATA Information Source:  Family Interview  Financial and Community Resources Employment:   Did not discuss at this time.   Financial resources:  Private Insurance If Medicaid - County:    School / Grade:   Maternity Care Coordinator / Child Services Coordination / Early Interventions:   CC4C  Cultural issues impacting care:   None stated    III  STRENGTHS Strengths  Adequate Resources  Compliance with medical plan  Home prepared for Child (including basic supplies)  Supportive family/friends  Understanding of illness   Strength comment:    IV  RISK FACTORS AND CURRENT PROBLEMS Current Problem:  None   Risk Factor & Current Problem Patient Issue Family Issue Risk Factor / Current Problem Comment   N N     V  SOCIAL WORK ASSESSMENT  CSW met with parents in MOB's third floor room/317 to introduce myself, offer support and complete assessment due to NICU admission of baby born at 33.4 weeks.  Parents were pleasant and welcoming of CSW's visit.  MOB appeared as though she had been crying prior to CSW's arrival.  CSW explained support services offered by NICU CSW.  MOB states she has just learned that she will not be discharged today, which has  disappointed her.  She states she feels ready to go home.  She states she has been crying, which she expects is due to hormones.  CSW agrees, but also suggests that she may be feeling emotional due to baby's premature birth and admission to NICU.  MOB acknowledged this.  CSW validated MOB's emotions and encouraged her to allow herself to be emotional.  She reports that under the circumstances, she concludes that everything (labor and delivery) went as well as it possibly could have.  She and FOB are thankful for this.  Parents state no questions about the NICU other than that they will want to hold baby as much as possible, but understand that his sleeping is essential to his growing.  MOB asked how often she will be allowed to hold Chase.  CSW explained that skin to skin care is extremely beneficial to mother, father and to baby and that there will be many opportunities to hold baby, but that when the best time will be is best determined by his bedside RNs.  CSW encouraged parents to ask this question of the RNs caring for baby.  Parents agreed.  CSW commended them for realizing how important it will be to allow baby to sleep as well.  CSW discussed common emotions in the weeks following delivery and also signs and symptoms of PPD.  CSW asked that MOB talk with CSW and or her doctor if symptoms arise or   if she has concerns about her emotions at any time.  MOB agreed.  Parents report having a great natural support system and most needed baby items already.  FOB states the baby shower was planned for this past weekend, but they plan to reschedule for this coming weekend.  Parents seemed appreciative of CSW's concern for their emotional wellbeing, state no questions, concerns or needs at this time and thanked CSW.  CSW has no social concerns at this time and gave contact information.   VI SOCIAL WORK PLAN Social Work Plan  Psychosocial Support/Ongoing Assessment of Needs  Patient/Family Education   Type of  pt/family education:   Ongoing support services offered by NICU CSW  PPD signs and symptoms   If child protective services report - county:   If child protective services report - date:   Information/referral to community resources comment:   No referral  needs noted at this time   Other social work plan:   

## 2014-11-12 NOTE — Progress Notes (Signed)
Pt discharged home with significant other.  Condition stable.  Pt ambulated to car with RN.  No equipment for home ordered at discharge.

## 2014-11-12 NOTE — Progress Notes (Signed)
Addendum Filed Vitals:   11/11/14 2315 11/12/14 0203 11/12/14 0556 11/12/14 1000  BP: 138/87 156/87 147/100 151/94  Pulse:  107 93 110  Temp:  97.5 F (36.4 C) 98.2 F (36.8 C) 97.9 F (36.6 C)  TempSrc:  Oral Oral Oral  Resp:  18 18 18   Height:      Weight:   114 lb (51.71 kg)   SpO2:  99% 99% 100%   Per Dr Normand Sloopillard, no discharge today, possibility  for tomorrow

## 2014-11-12 NOTE — Discharge Instructions (Signed)

## 2014-11-12 NOTE — Progress Notes (Signed)
Diana Palmer   Subjective: Post Partum Day 3 Vaginal delivery.  Persistently elevated BP pp--gestational hypertension, Bilateral periurethrals, no repair required, hemostatic Patient up ad lib, denies syncope or dizziness. Reports consuming regular diet without issues and denies N/V No issues with urination and reports bleeding is appropriate  Feeding:  Breast pumping Contraceptive plan:   Unsure Baby doing well in NICU  Pt received hydralazine x1 last night Procardia XL increase from $RemoveBefor'30mg'HgRSbujzkKHp$  QD to $Rem'60mg'nvya$  QD  Objective: Temp:  [97.5 F (36.4 C)-98.7 F (37.1 C)] 97.9 F (36.6 C) (02/08 1000) Pulse Rate:  [77-110] 110 (02/08 1000) Resp:  [18] 18 (02/08 1000) BP: (138-173)/(87-111) 151/94 mmHg (02/08 1000) SpO2:  [99 %-100 %] 100 % (02/08 1000) Weight:  [114 lb (51.71 kg)] 114 lb (51.71 kg) (02/08 0556)   Filed Vitals:   11/11/14 2315 11/12/14 0203 11/12/14 0556 11/12/14 1000  BP: 138/87 156/87 147/100 151/94  Pulse:  107 93 110  Temp:  97.5 F (36.4 C) 98.2 F (36.8 C) 97.9 F (36.6 C)  TempSrc:  Oral Oral Oral  Resp:  $Remo'18 18 18  'FhopL$ Height:      Weight:   114 lb (51.71 kg)   SpO2:  99% 99% 100%     Recent Results (from the past 2160 hour(s))  Type and screen     Status: None   Collection Time: 11/09/14 11:28 AM  Result Value Ref Range   ABO/RH(D) A POS    Antibody Screen NEG    Sample Expiration 11/12/2014   CBC     Status: Abnormal   Collection Time: 11/09/14 11:28 AM  Result Value Ref Range   WBC 16.4 (H) 4.0 - 10.5 K/uL   RBC 3.57 (L) 3.87 - 5.11 MIL/uL   Hemoglobin 11.8 (L) 12.0 - 15.0 g/dL   HCT 33.6 (L) 36.0 - 46.0 %   MCV 94.1 78.0 - 100.0 fL   MCH 33.1 26.0 - 34.0 pg   MCHC 35.1 30.0 - 36.0 g/dL   RDW 12.5 11.5 - 15.5 %   Platelets 212 150 - 400 K/uL  RPR     Status: None   Collection Time: 11/09/14 11:28 AM  Result Value Ref Range   RPR Ser Ql Non Reactive Non Reactive    Comment: (NOTE) Performed At: [facility] [place], [place] [id number] Lindon Romp MD MW:[id number]   Comprehensive metabolic panel     Status: Abnormal   Collection Time: 11/09/14 11:28 AM  Result Value Ref Range   Sodium 131 (L) 135 - 145 mmol/L   Potassium 3.5 3.5 - 5.1 mmol/L   Chloride 106 96 - 112 mmol/L   CO2 14 (L) 19 - 32 mmol/L   Glucose, Bld 101 (H) 70 - 99 mg/dL   BUN 14 6 - 23 mg/dL   Creatinine, Ser 0.87 0.50 - 1.10 mg/dL   Calcium 8.6 8.4 - 10.5 mg/dL   Total Protein 5.7 (L) 6.0 - 8.3 g/dL   Albumin 2.8 (L) 3.5 - 5.2 g/dL   AST 32 0 - 37 U/L   ALT 12 0 - 35 U/L   Alkaline Phosphatase 108 39 - 117 U/L   Total Bilirubin 0.5 0.3 - 1.2 mg/dL   GFR calc non Af Amer 89 (L) >90 mL/min   GFR calc Af Amer >90 >90 mL/min    Comment: (NOTE) The eGFR has been calculated using the CKD EPI equation. This calculation has not been validated in all clinical situations.  eGFR's persistently <90 mL/min signify possible Chronic Kidney Disease.    Anion gap 11 5 - 15  Lactate dehydrogenase     Status: None   Collection Time: 11/09/14 11:28 AM  Result Value Ref Range   LDH 171 94 - 250 U/L  Uric acid     Status: None   Collection Time: 11/09/14 11:28 AM  Result Value Ref Range   Uric Acid, Serum 7.0 2.4 - 7.0 mg/dL  ABO/Rh     Status: None   Collection Time: 11/09/14 11:30 AM  Result Value Ref Range   ABO/RH(D) A POS   Urinalysis, Routine w reflex microscopic     Status: Abnormal   Collection Time: 11/09/14 11:40 AM  Result Value Ref Range   Color, Urine YELLOW YELLOW   APPearance CLEAR CLEAR   Specific Gravity, Urine <1.005 (L) 1.005 - 1.030   pH 6.0 5.0 - 8.0   Glucose, UA NEGATIVE NEGATIVE mg/dL   Hgb urine dipstick LARGE (A) NEGATIVE   Bilirubin Urine NEGATIVE NEGATIVE   Ketones, ur NEGATIVE NEGATIVE mg/dL   Protein, ur NEGATIVE NEGATIVE mg/dL   Urobilinogen, UA 0.2 0.0 - 1.0 mg/dL   Nitrite NEGATIVE NEGATIVE   Leukocytes, UA NEGATIVE NEGATIVE  Protein / creatinine ratio, urine     Status: Abnormal    Collection Time: 11/09/14 11:40 AM  Result Value Ref Range   Creatinine, Urine 74.00 mg/dL   Total Protein, Urine 18 mg/dL    Comment: NO NORMAL RANGE ESTABLISHED FOR THIS TEST   Protein Creatinine Ratio 0.24 (H) 0.00 - 0.15  Urine microscopic-add on     Status: None   Collection Time: 11/09/14 11:40 AM  Result Value Ref Range   Squamous Epithelial / LPF RARE RARE   RBC / HPF 0-2 <3 RBC/hpf  CBC     Status: Abnormal   Collection Time: 11/10/14  5:07 AM  Result Value Ref Range   WBC 15.2 (H) 4.0 - 10.5 K/uL   RBC 2.88 (L) 3.87 - 5.11 MIL/uL   Hemoglobin 9.6 (L) 12.0 - 15.0 g/dL    Comment: DELTA CHECK NOTED REPEATED TO VERIFY    HCT 26.9 (L) 36.0 - 46.0 %   MCV 93.4 78.0 - 100.0 fL   MCH 33.3 26.0 - 34.0 pg   MCHC 35.7 30.0 - 36.0 g/dL   RDW 12.5 11.5 - 15.5 %   Platelets 158 150 - 400 K/uL    Comment: DELTA CHECK NOTED REPEATED TO VERIFY SPECIMEN CHECKED FOR CLOTS   CBC     Status: Abnormal   Collection Time: 11/11/14  8:17 AM  Result Value Ref Range   WBC 15.6 (H) 4.0 - 10.5 K/uL   RBC 2.90 (L) 3.87 - 5.11 MIL/uL   Hemoglobin 9.6 (L) 12.0 - 15.0 g/dL   HCT 27.5 (L) 36.0 - 46.0 %   MCV 94.8 78.0 - 100.0 fL   MCH 33.1 26.0 - 34.0 pg   MCHC 34.9 30.0 - 36.0 g/dL   RDW 12.8 11.5 - 15.5 %   Platelets 167 150 - 400 K/uL  Comprehensive metabolic panel     Status: Abnormal   Collection Time: 11/11/14  8:17 AM  Result Value Ref Range   Sodium 141 135 - 145 mmol/L    Comment: REPEATED TO VERIFY DELTA CHECK NOTED    Potassium 4.0 3.5 - 5.1 mmol/L   Chloride 110 96 - 112 mmol/L   CO2 24 19 - 32 mmol/L   Glucose, Bld 90 70 -  99 mg/dL   BUN 8 6 - 23 mg/dL   Creatinine, Ser 0.64 0.50 - 1.10 mg/dL   Calcium 8.4 8.4 - 10.5 mg/dL   Total Protein 5.3 (L) 6.0 - 8.3 g/dL   Albumin 2.6 (L) 3.5 - 5.2 g/dL   AST 29 0 - 37 U/L   ALT 17 0 - 35 U/L   Alkaline Phosphatase 77 39 - 117 U/L   Total Bilirubin <0.1 (L) 0.3 - 1.2 mg/dL    Comment: REPEATED TO VERIFY   GFR calc non Af  Amer >90 >90 mL/min   GFR calc Af Amer >90 >90 mL/min    Comment: (NOTE) The eGFR has been calculated using the CKD EPI equation. This calculation has not been validated in all clinical situations. eGFR's persistently <90 mL/min signify possible Chronic Kidney Disease.    Anion gap 7 5 - 15  Lactate dehydrogenase     Status: None   Collection Time: 11/11/14  8:17 AM  Result Value Ref Range   LDH 211 94 - 250 U/L  Uric acid     Status: None   Collection Time: 11/11/14  8:17 AM  Result Value Ref Range   Uric Acid, Serum 5.3 2.4 - 7.0 mg/dL  Protein / creatinine ratio, urine     Status: None   Collection Time: 11/11/14  8:40 AM  Result Value Ref Range   Creatinine, Urine 17.00 mg/dL   Total Protein, Urine <6 mg/dL    Comment: NO NORMAL RANGE ESTABLISHED FOR THIS TEST REPEATED TO VERIFY    Protein Creatinine Ratio        0.00 - 0.15    Comment: RESULT BELOW REPORTABLE RANGE, UNABLE TO CALCULATE.   Young Harris labs wnl   Physical Exam:  General: alert and cooperative Ext: WNL, no edema. No evidence of DVT seen on physical exam. Breast: Soft filling Lungs: CTAB Heart RRR without murmur  Abdomen:  Soft, fundus firm, lochia scant, + bowel sounds, non distended, non tender Lochia: appropriate Uterine Fundus: firm Laceration: healing well    Recent Labs  11/10/14 0507 11/11/14 0817  HGB 9.6* 9.6*  HCT 26.9* 27.5*    Assessment S/P Vaginal Delivery-Day 3, Persistently elevated BP pp--gestational hypertension  Stable  Normal Involution Breast pumping Circumcision: TBD Asymptomatic anemia  Plan: Continue current care Consult with Dr Charlesetta Garibaldi Possible discharge later today Lactation consult    Eevie Lapp, CNM, MSN 11/12/2014, 10:18 AM

## 2016-09-23 LAB — OB RESULTS CONSOLE RUBELLA ANTIBODY, IGM: RUBELLA: IMMUNE

## 2016-09-23 LAB — OB RESULTS CONSOLE GC/CHLAMYDIA
Chlamydia: NEGATIVE
GC PROBE AMP, GENITAL: NEGATIVE

## 2016-09-23 LAB — OB RESULTS CONSOLE ABO/RH: RH Type: POSITIVE

## 2016-09-23 LAB — OB RESULTS CONSOLE RPR: RPR: NONREACTIVE

## 2016-09-23 LAB — OB RESULTS CONSOLE ANTIBODY SCREEN: ANTIBODY SCREEN: NEGATIVE

## 2016-09-23 LAB — OB RESULTS CONSOLE HIV ANTIBODY (ROUTINE TESTING): HIV: NONREACTIVE

## 2016-09-23 LAB — OB RESULTS CONSOLE HEPATITIS B SURFACE ANTIGEN: HEP B S AG: NEGATIVE

## 2016-10-05 NOTE — L&D Delivery Note (Signed)
Delivery Note At  a viable female was delivered via  (Presentation:vtx ;OA  ).  APGAR:8 ,9 ; weight pending  .   Placenta status: complete, .3V  Cord:  with the following complications:none .   Anesthesia:  Epidural Episiotomy:  None Lacerations:  None Est. Blood Loss (mL):  50cc Placenta to pathology for IUGR Mom to postpartum.  Baby to Couplet care / Skin to Skin.  Henderson Newcomerancy Jean Kathline Banbury 04/28/2017, 12:48 AM

## 2017-04-02 ENCOUNTER — Inpatient Hospital Stay (HOSPITAL_COMMUNITY): Payer: BLUE CROSS/BLUE SHIELD

## 2017-04-02 ENCOUNTER — Inpatient Hospital Stay (HOSPITAL_COMMUNITY)
Admission: AD | Admit: 2017-04-02 | Discharge: 2017-04-02 | Disposition: A | Discharge status: Home / Self Care | Payer: BLUE CROSS/BLUE SHIELD | Source: Ambulatory Visit | Attending: Obstetrics & Gynecology | Admitting: Obstetrics & Gynecology

## 2017-04-02 ENCOUNTER — Encounter (HOSPITAL_COMMUNITY): Payer: Self-pay | Admitting: *Deleted

## 2017-04-02 DIAGNOSIS — Z8249 Family history of ischemic heart disease and other diseases of the circulatory system: Secondary | ICD-10-CM | POA: Diagnosis not present

## 2017-04-02 DIAGNOSIS — Z87891 Personal history of nicotine dependence: Secondary | ICD-10-CM | POA: Insufficient documentation

## 2017-04-02 DIAGNOSIS — O365990 Maternal care for other known or suspected poor fetal growth, unspecified trimester, not applicable or unspecified: Secondary | ICD-10-CM

## 2017-04-02 DIAGNOSIS — O4703 False labor before 37 completed weeks of gestation, third trimester: Secondary | ICD-10-CM | POA: Insufficient documentation

## 2017-04-02 DIAGNOSIS — O365931 Maternal care for other known or suspected poor fetal growth, third trimester, fetus 1: Secondary | ICD-10-CM

## 2017-04-02 DIAGNOSIS — Z3A33 33 weeks gestation of pregnancy: Secondary | ICD-10-CM | POA: Diagnosis not present

## 2017-04-02 DIAGNOSIS — O365930 Maternal care for other known or suspected poor fetal growth, third trimester, not applicable or unspecified: Secondary | ICD-10-CM | POA: Insufficient documentation

## 2017-04-02 DIAGNOSIS — Z803 Family history of malignant neoplasm of breast: Secondary | ICD-10-CM | POA: Diagnosis not present

## 2017-04-02 LAB — CBC
HCT: 31.1 % — ABNORMAL LOW (ref 36.0–46.0)
Hemoglobin: 11 g/dL — ABNORMAL LOW (ref 12.0–15.0)
MCH: 33.8 pg (ref 26.0–34.0)
MCHC: 35.4 g/dL (ref 30.0–36.0)
MCV: 95.7 fL (ref 78.0–100.0)
Platelets: 199 10*3/uL (ref 150–400)
RBC: 3.25 MIL/uL — ABNORMAL LOW (ref 3.87–5.11)
RDW: 12.8 % (ref 11.5–15.5)
WBC: 15.3 10*3/uL — ABNORMAL HIGH (ref 4.0–10.5)

## 2017-04-02 LAB — URINALYSIS, ROUTINE W REFLEX MICROSCOPIC
Bilirubin Urine: NEGATIVE
Glucose, UA: NEGATIVE mg/dL
Hgb urine dipstick: NEGATIVE
Ketones, ur: 20 mg/dL — AB
Leukocytes, UA: NEGATIVE
Nitrite: NEGATIVE
PROTEIN: NEGATIVE mg/dL
Specific Gravity, Urine: 1.009 (ref 1.005–1.030)
pH: 6 (ref 5.0–8.0)

## 2017-04-02 LAB — COMPREHENSIVE METABOLIC PANEL
ALT: 12 U/L — ABNORMAL LOW (ref 14–54)
AST: 19 U/L (ref 15–41)
Albumin: 3.1 g/dL — ABNORMAL LOW (ref 3.5–5.0)
Alkaline Phosphatase: 86 U/L (ref 38–126)
Anion gap: 6 (ref 5–15)
BUN: 10 mg/dL (ref 6–20)
CO2: 22 mmol/L (ref 22–32)
Calcium: 8.8 mg/dL — ABNORMAL LOW (ref 8.9–10.3)
Chloride: 107 mmol/L (ref 101–111)
Creatinine, Ser: 0.63 mg/dL (ref 0.44–1.00)
GFR calc Af Amer: >60 mL/min (ref >60)
GFR calc non Af Amer: >60 mL/min (ref >60)
Glucose, Bld: 105 mg/dL — ABNORMAL HIGH (ref 65–99)
Potassium: 3.2 mmol/L — ABNORMAL LOW (ref 3.5–5.1)
Sodium: 135 mmol/L (ref 135–145)
Total Bilirubin: 0.6 mg/dL (ref 0.3–1.2)
Total Protein: 6.4 g/dL — ABNORMAL LOW (ref 6.5–8.1)

## 2017-04-02 LAB — PROTEIN / CREATININE RATIO, URINE
Creatinine, Urine: 60 mg/dL
Total Protein, Urine: <6 mg/dL

## 2017-04-02 MED ORDER — LACTATED RINGERS IV SOLN
INTRAVENOUS | Status: DC
  Administered 2017-04-02: 17:00:00 via INTRAVENOUS

## 2017-04-02 MED ORDER — LACTATED RINGERS IV BOLUS (SEPSIS)
1000.0000 mL | Freq: Once | INTRAVENOUS | Status: AC
  Administered 2017-04-02: 1000 mL via INTRAVENOUS

## 2017-04-02 MED ORDER — NIFEDIPINE 10 MG PO CAPS
30.0000 mg | ORAL_CAPSULE | Freq: Once | ORAL | Status: AC
  Administered 2017-04-02: 30 mg via ORAL
  Filled 2017-04-02: qty 3

## 2017-04-02 MED ORDER — BETAMETHASONE SOD PHOS & ACET 6 (3-3) MG/ML IJ SUSP
12.0000 mg | INTRAMUSCULAR | Status: DC
  Administered 2017-04-02: 12 mg via INTRAMUSCULAR
  Filled 2017-04-02: qty 2

## 2017-04-02 MED ORDER — NIFEDIPINE 10 MG PO CAPS
10.0000 mg | ORAL_CAPSULE | Freq: Once | ORAL | Status: AC
  Administered 2017-04-02: 10 mg via ORAL
  Filled 2017-04-02: qty 1

## 2017-04-02 NOTE — MAU Note (Signed)
Pt presents to MAU after being evaluated by physician for contractions. Pt denies any VB or LOF.

## 2017-04-02 NOTE — Progress Notes (Signed)
Discussed POC with Illene BolusLori Clemmons, CNM.  Notified Lawson FiscalLori that preliminary BPP was 8/8.  Patient to be discharged home with labor precautions and come back to MAU Saturday 6/30 for second dose of BMZ.  Pt to call the office on Monday to schedule a f/u appointment and be seen Monday 04/05/17.  Illene BolusLori Clemmons, CNM to put order in.

## 2017-04-02 NOTE — MAU Provider Note (Signed)
History     CSN: [id number]  Arrival date & time 04/02/17  1421   First Provider Initiated Contact with Patient 04/02/17 1449      Chief Complaint  Patient presents with  . Contractions    [id number]@ 30w3dpresents from the office with contractions every 2 minutes. Hx of PTD and IUGR with this pregnancy. BPP in office 6/8 off for breathing. AFI normal. Dopplers normal. FFN collected and pending per Dr. DCharlesetta Garibaldi Cervix FT/Long/-3. She does not feel contractions. Reassurred pt that she will be monitored and try to stop contractions and send her home    Past Medical History:  Diagnosis Date  . Attention deficit disorder without mention of hyperactivity   . Cervical dysplasia   . Elevated blood pressure reading without diagnosis of hypertension     Past Surgical History:  Procedure Laterality Date  . DILATION AND CURETTAGE OF UTERUS    . WISDOM TOOTH EXTRACTION      Family History  Problem Relation Age of Onset  . Hypertension Father   . Breast cancer Paternal Grandmother   . Hypertension Paternal Grandfather     Social History  Substance Use Topics  . Smoking status: Former Smoker    Packs/day: 1.00    Years: 1.00    Types: Cigarettes    Quit date: 10/05/2006  . Smokeless tobacco: Never Used  . Alcohol use 2.5 oz/week    5 Standard drinks or equivalent per week     Comment: not in pregnancy    OB History    Gravida Para Term Preterm AB Living   '4 1   1 2 1   '$ SAB TAB Ectopic Multiple Live Births     2   0 1      Review of Systems  All other systems reviewed and are negative.   Allergies  Patient has no known allergies.  Home Medications    BP 128/84   Pulse 87   Temp 97.8 F (36.6 C)   Resp 18   Wt 125 lb (56.7 kg)   LMP 08/11/2016   BMI 24.41 kg/m   Physical Exam  Constitutional: She appears well-developed and well-nourished.  HENT:  Head: Normocephalic and atraumatic.  Cardiovascular: Normal rate.   Pulmonary/Chest: Effort normal and breath  sounds normal.  Abdominal: Soft. Bowel sounds are normal. There is no tenderness.  Musculoskeletal: Normal range of motion.  Skin: Skin is warm and dry.  Psychiatric: She has a normal mood and affect. Her behavior is normal.  Nursing note and vitals reviewed.  Results for orders placed or performed during the hospital encounter of 04/02/17 (from the past 48 hour(s))  Urinalysis, Routine w reflex microscopic     Status: Abnormal   Collection Time: 04/02/17  2:38 PM  Result Value Ref Range   Color, Urine YELLOW YELLOW   APPearance CLEAR CLEAR   Specific Gravity, Urine 1.009 1.005 - 1.030   pH 6.0 5.0 - 8.0   Glucose, UA NEGATIVE NEGATIVE mg/dL   Hgb urine dipstick NEGATIVE NEGATIVE   Bilirubin Urine NEGATIVE NEGATIVE   Ketones, ur 20 (A) NEGATIVE mg/dL   Protein, ur NEGATIVE NEGATIVE mg/dL   Nitrite NEGATIVE NEGATIVE   Leukocytes, UA NEGATIVE NEGATIVE  Protein / creatinine ratio, urine     Status: None   Collection Time: 04/02/17  2:38 PM  Result Value Ref Range   Creatinine, Urine 60.00 mg/dL   Total Protein, Urine <6 mg/dL    Comment: REPEATED TO VERIFY NO NORMAL  RANGE ESTABLISHED FOR THIS TEST    Protein Creatinine Ratio        0.00 - 0.15 mg/mg[Cre]    Comment: RESULT BELOW REPORTABLE RANGE, UNABLE TO CALCULATE.   CBC     Status: Abnormal   Collection Time: 04/02/17  3:40 PM  Result Value Ref Range   WBC 15.3 (H) 4.0 - 10.5 K/uL   RBC 3.25 (L) 3.87 - 5.11 MIL/uL   Hemoglobin 11.0 (L) 12.0 - 15.0 g/dL   HCT 31.1 (L) 36.0 - 46.0 %   MCV 95.7 78.0 - 100.0 fL   MCH 33.8 26.0 - 34.0 pg   MCHC 35.4 30.0 - 36.0 g/dL   RDW 12.8 11.5 - 15.5 %   Platelets 199 150 - 400 K/uL  Comprehensive metabolic panel     Status: Abnormal   Collection Time: 04/02/17  3:40 PM  Result Value Ref Range   Sodium 135 135 - 145 mmol/L   Potassium 3.2 (L) 3.5 - 5.1 mmol/L   Chloride 107 101 - 111 mmol/L   CO2 22 22 - 32 mmol/L   Glucose, Bld 105 (H) 65 - 99 mg/dL   BUN 10 6 - 20 mg/dL    Creatinine, Ser 0.63 0.44 - 1.00 mg/dL   Calcium 8.8 (L) 8.9 - 10.3 mg/dL   Total Protein 6.4 (L) 6.5 - 8.1 g/dL   Albumin 3.1 (L) 3.5 - 5.0 g/dL   AST 19 15 - 41 U/L   ALT 12 (L) 14 - 54 U/L   Alkaline Phosphatase 86 38 - 126 U/L   Total Bilirubin 0.6 0.3 - 1.2 mg/dL   GFR calc non Af Amer >60 >60 mL/min   GFR calc Af Amer >60 >60 mL/min    Comment: (NOTE) The eGFR has been calculated using the CKD EPI equation. This calculation has not been validated in all clinical situations. eGFR's persistently <60 mL/min signify possible Chronic Kidney Disease.    Anion gap 6 5 - 15   MAU Course  Procedures Bpp Preliminary report 8/8. Prolonged monitoring for preterm contractions , after '40mg'$  Procardia and IVF and no cervical change. Betamethasone course initiated. Bpp 8/8. Pt dc'ed to home in good condition with PTL precautions.  Labs Reviewed  URINALYSIS, ROUTINE W REFLEX MICROSCOPIC   No results found.   1. IUGR (intrauterine growth restriction) affecting care of mother       MDM  1. Preterm contractions 2. IUGR  P1. Discharge-PTL precautions; Pelvic Rest    2. Return to MAU in 24 hours for 2nd beta shot.  [place] CNM 04/02/17 1900

## 2017-04-03 ENCOUNTER — Inpatient Hospital Stay (HOSPITAL_COMMUNITY)
Admission: AD | Admit: 2017-04-03 | Discharge: 2017-04-03 | Disposition: A | Discharge status: Home / Self Care | Payer: BLUE CROSS/BLUE SHIELD | Source: Ambulatory Visit | Attending: Obstetrics and Gynecology | Admitting: Obstetrics and Gynecology

## 2017-04-03 DIAGNOSIS — Z3A33 33 weeks gestation of pregnancy: Secondary | ICD-10-CM | POA: Diagnosis not present

## 2017-04-03 DIAGNOSIS — O365930 Maternal care for other known or suspected poor fetal growth, third trimester, not applicable or unspecified: Secondary | ICD-10-CM | POA: Insufficient documentation

## 2017-04-03 DIAGNOSIS — O4703 False labor before 37 completed weeks of gestation, third trimester: Secondary | ICD-10-CM | POA: Insufficient documentation

## 2017-04-03 MED ORDER — BETAMETHASONE SOD PHOS & ACET 6 (3-3) MG/ML IJ SUSP
12.0000 mg | Freq: Once | INTRAMUSCULAR | Status: AC
  Administered 2017-04-03: 12 mg via INTRAMUSCULAR
  Filled 2017-04-03: qty 2

## 2017-04-20 ENCOUNTER — Encounter (HOSPITAL_COMMUNITY): Payer: Self-pay | Admitting: *Deleted

## 2017-04-20 ENCOUNTER — Telehealth (HOSPITAL_COMMUNITY): Payer: Self-pay | Admitting: *Deleted

## 2017-04-20 NOTE — Telephone Encounter (Signed)
Preadmission screen  

## 2017-04-23 LAB — OB RESULTS CONSOLE GBS: GBS: POSITIVE

## 2017-04-26 ENCOUNTER — Other Ambulatory Visit: Payer: Self-pay | Admitting: Obstetrics and Gynecology

## 2017-04-27 ENCOUNTER — Inpatient Hospital Stay (HOSPITAL_COMMUNITY): Payer: BLUE CROSS/BLUE SHIELD | Admitting: Anesthesiology

## 2017-04-27 ENCOUNTER — Inpatient Hospital Stay (HOSPITAL_COMMUNITY)
Admission: RE | Admit: 2017-04-27 | Discharge: 2017-04-29 | DRG: 775 | Disposition: A | Discharge status: Home / Self Care | Payer: BLUE CROSS/BLUE SHIELD | Source: Ambulatory Visit | Attending: Obstetrics and Gynecology | Admitting: Obstetrics and Gynecology

## 2017-04-27 ENCOUNTER — Encounter (HOSPITAL_COMMUNITY): Payer: Self-pay

## 2017-04-27 DIAGNOSIS — O365990 Maternal care for other known or suspected poor fetal growth, unspecified trimester, not applicable or unspecified: Secondary | ICD-10-CM

## 2017-04-27 DIAGNOSIS — Z3A37 37 weeks gestation of pregnancy: Secondary | ICD-10-CM | POA: Diagnosis not present

## 2017-04-27 DIAGNOSIS — O365930 Maternal care for other known or suspected poor fetal growth, third trimester, not applicable or unspecified: Principal | ICD-10-CM | POA: Diagnosis present

## 2017-04-27 DIAGNOSIS — B951 Streptococcus, group B, as the cause of diseases classified elsewhere: Secondary | ICD-10-CM | POA: Diagnosis present

## 2017-04-27 DIAGNOSIS — O99824 Streptococcus B carrier state complicating childbirth: Secondary | ICD-10-CM | POA: Diagnosis present

## 2017-04-27 DIAGNOSIS — Z8759 Personal history of other complications of pregnancy, childbirth and the puerperium: Secondary | ICD-10-CM

## 2017-04-27 LAB — CBC
HCT: 31.8 % — ABNORMAL LOW (ref 36.0–46.0)
HEMOGLOBIN: 11.1 g/dL — AB (ref 12.0–15.0)
MCH: 33.2 pg (ref 26.0–34.0)
MCHC: 34.9 g/dL (ref 30.0–36.0)
MCV: 95.2 fL (ref 78.0–100.0)
PLATELETS: 187 10*3/uL (ref 150–400)
RBC: 3.34 MIL/uL — ABNORMAL LOW (ref 3.87–5.11)
RDW: 13.2 % (ref 11.5–15.5)
WBC: 10.9 10*3/uL — ABNORMAL HIGH (ref 4.0–10.5)

## 2017-04-27 LAB — TYPE AND SCREEN
ABO/RH(D): A POS
ANTIBODY SCREEN: NEGATIVE

## 2017-04-27 MED ORDER — FLEET ENEMA 7-19 GM/118ML RE ENEM: 1.0000 | ENEMA | RECTAL | Status: DC | PRN

## 2017-04-27 MED ORDER — OXYTOCIN 40 UNITS IN LACTATED RINGERS INFUSION - SIMPLE MED
1.0000 m[IU]/min | INTRAVENOUS | Status: DC
  Administered 2017-04-27: 1 m[IU]/min via INTRAVENOUS
  Filled 2017-04-27 (×2): qty 1000

## 2017-04-27 MED ORDER — LACTATED RINGERS IV SOLN: INTRAVENOUS | Status: DC

## 2017-04-27 MED ORDER — OXYTOCIN BOLUS FROM INFUSION
500.0000 mL | Freq: Once | INTRAVENOUS | Status: AC
  Administered 2017-04-28: 500 mL via INTRAVENOUS

## 2017-04-27 MED ORDER — EPHEDRINE 5 MG/ML INJ
10.0000 mg | INTRAVENOUS | Status: DC | PRN
  Filled 2017-04-27: qty 2

## 2017-04-27 MED ORDER — FENTANYL 2.5 MCG/ML BUPIVACAINE 1/10 % EPIDURAL INFUSION (WH - ANES)
14.0000 mL/h | INTRAMUSCULAR | Status: DC | PRN
  Administered 2017-04-27: 14 mL/h via EPIDURAL
  Filled 2017-04-27: qty 100

## 2017-04-27 MED ORDER — PHENYLEPHRINE 40 MCG/ML (10ML) SYRINGE FOR IV PUSH (FOR BLOOD PRESSURE SUPPORT)
80.0000 ug | PREFILLED_SYRINGE | INTRAVENOUS | Status: DC | PRN
  Filled 2017-04-27: qty 5

## 2017-04-27 MED ORDER — PHENYLEPHRINE 40 MCG/ML (10ML) SYRINGE FOR IV PUSH (FOR BLOOD PRESSURE SUPPORT)
80.0000 ug | PREFILLED_SYRINGE | INTRAVENOUS | Status: DC | PRN
  Administered 2017-04-27: 80 ug via INTRAVENOUS
  Filled 2017-04-27: qty 10
  Filled 2017-04-27: qty 5

## 2017-04-27 MED ORDER — DEXTROSE 5 % IV SOLN
5.0000 10*6.[IU] | Freq: Once | INTRAVENOUS | Status: AC
  Administered 2017-04-27: 5 10*6.[IU] via INTRAVENOUS
  Filled 2017-04-27: qty 5

## 2017-04-27 MED ORDER — LACTATED RINGERS IV SOLN: 500.0000 mL | INTRAVENOUS | Status: DC | PRN

## 2017-04-27 MED ORDER — LIDOCAINE HCL (PF) 1 % IJ SOLN
INTRAMUSCULAR | Status: DC | PRN
  Administered 2017-04-27: 4 mL
  Administered 2017-04-27: 6 mL via EPIDURAL

## 2017-04-27 MED ORDER — DIPHENHYDRAMINE HCL 50 MG/ML IJ SOLN: 12.5000 mg | INTRAMUSCULAR | Status: DC | PRN

## 2017-04-27 MED ORDER — ACETAMINOPHEN 325 MG PO TABS: 650.0000 mg | ORAL_TABLET | ORAL | Status: DC | PRN

## 2017-04-27 MED ORDER — LACTATED RINGERS IV SOLN: 500.0000 mL | Freq: Once | INTRAVENOUS | Status: DC

## 2017-04-27 MED ORDER — SOD CITRATE-CITRIC ACID 500-334 MG/5ML PO SOLN: 30.0000 mL | ORAL | Status: DC | PRN

## 2017-04-27 MED ORDER — MISOPROSTOL 25 MCG QUARTER TABLET
25.0000 ug | ORAL_TABLET | ORAL | Status: DC | PRN
  Administered 2017-04-27: 25 ug via VAGINAL
  Filled 2017-04-27 (×2): qty 1

## 2017-04-27 MED ORDER — OXYTOCIN 40 UNITS IN LACTATED RINGERS INFUSION - SIMPLE MED
2.5000 [IU]/h | INTRAVENOUS | Status: DC
  Administered 2017-04-28: 2.5 [IU]/h via INTRAVENOUS

## 2017-04-27 MED ORDER — PENICILLIN G POT IN DEXTROSE 60000 UNIT/ML IV SOLN
3.0000 10*6.[IU] | INTRAVENOUS | Status: DC
  Administered 2017-04-27 (×2): 3 10*6.[IU] via INTRAVENOUS
  Filled 2017-04-27 (×6): qty 50

## 2017-04-27 MED ORDER — ONDANSETRON HCL 4 MG/2ML IJ SOLN: 4.0000 mg | Freq: Four times a day (QID) | INTRAMUSCULAR | Status: DC | PRN

## 2017-04-27 MED ORDER — FENTANYL CITRATE (PF) 100 MCG/2ML IJ SOLN
50.0000 ug | INTRAMUSCULAR | Status: DC | PRN
  Administered 2017-04-27 (×2): 100 ug via INTRAVENOUS
  Filled 2017-04-27 (×2): qty 2

## 2017-04-27 NOTE — Anesthesia Procedure Notes (Signed)
Epidural Patient location during procedure: OB  Staffing Anesthesiologist: Cristela BlueJACKSON, Diana Palmer  Preanesthetic Checklist Completed: patient identified, site marked, surgical consent, pre-op evaluation, timeout performed, IV checked, risks and benefits discussed and monitors and equipment checked  Epidural Patient position: sitting Prep: site prepped and draped and DuraPrep Patient monitoring: continuous pulse ox and blood pressure Approach: midline Injection technique: LOR air  Needle:  Needle type: Tuohy  Needle gauge: 17 G Needle length: 9 cm and 9 Needle insertion depth: 5 cm cm Catheter type: closed end flexible Catheter size: 19 Gauge Catheter at skin depth: 10 cm Test dose: negative  Assessment Events: blood not aspirated, injection not painful, no injection resistance, negative IV test and no paresthesia  Additional Notes Dosing of Epidural:  1st dose, through catheter ............................................Marland Kitchen.  Xylocaine 40 mg  2nd dose, through catheter, after waiting 3 minutes........Marland Kitchen.Xylocaine 60 mg    As each dose occurred, patient was free of IV sx; and patient exhibited no evidence of SA injection.  Patient is more comfortable after epidural dosed. Please see RN's note for documentation of vital signs,and FHR which are stable.  Patient reminded not to try to ambulate with numb legs, and that an RN must be present when she attempts to get up.

## 2017-04-27 NOTE — Anesthesia Pain Management Evaluation Note (Signed)
  CRNA Pain Management Visit Note  Patient: Diana Palmer, 31 y.o., female  "Hello I am a member of the anesthesia team at [facility]. We have an anesthesia team available at all times to provide care throughout the hospital, including epidural management and anesthesia for C-section. I don't know your plan for the delivery whether it a natural birth, water birth, IV sedation, nitrous supplementation, doula or epidural, but we want to meet your pain goals."   1.Was your pain managed to your expectations on prior hospitalizations?   Yes   2.What is your expectation for pain management during this hospitalization?     Epidural  3.How can we help you reach that goal? unsure  Record the patient's initial score and the patient's pain goal.   Pain: 0  Pain Goal: 6 [facility] wants you to be able to say your pain was always managed very well.  Cephus ShellingBURGER,Cozetta Seif 04/27/2017

## 2017-04-27 NOTE — Progress Notes (Signed)
LABOR PROGRESS NOTE  Diana Palmer is a 31 y.o. [phone] at 2724w0d  admitted for IUGR @ 37 weeks  Subjective: Pt is feeling contractions; Pit going. Pt is planning epidural  Objective: Temp 98.3 F (36.8 C) (Oral)   Resp 16   Ht 5' (1.524 m)   Wt 125 lb (56.7 kg)   LMP 08/11/2016   BMI 24.41 kg/m  or  Vitals:   04/27/17 0735 04/27/17 1600  Resp:  16  Temp:  98.3 F (36.8 C)  TempSrc:  Oral  Weight: 125 lb (56.7 kg)   Height: 5' (1.524 m)      Dilation: 2 Effacement (%): 50 Cervical Position: Middle Station: 0 Presentation: Vertex Exam by:: Milus GlazierJennifer Hamilton, RN  Labs: Lab Results  Component Value Date   WBC 10.9 (H) 04/27/2017   HGB 11.1 (L) 04/27/2017   HCT 31.8 (L) 04/27/2017   MCV 95.2 04/27/2017   PLT 187 04/27/2017    Patient Active Problem List   Diagnosis Date Noted  . Small for gestational age fetus during pregnancy in third trimester 04/27/2017  . Positive GBS test 04/27/2017  . Vaginal delivery 11/09/2014  . Preterm delivery 11/09/2014  . Diarrhea 07/08/2013  . Cervical dysplasia     Assessment / Plan: IUGR Induction  Labor: cervical ripening Fetal Wellbeing:  Cat 1 Pain Control:  none Anticipated MOD:  SVD  Tejay Hubert A Ethelle Ola CNM 04/27/2017, 4:52 PM

## 2017-04-27 NOTE — Anesthesia Preprocedure Evaluation (Signed)
Anesthesia Evaluation  Patient identified by MRN, date of birth, ID band Patient awake    Reviewed: Allergy & Precautions, H&P , Patient's Chart, lab work & pertinent test results  Airway Mallampati: II  TM Distance: >3 FB Neck ROM: full    Dental  (+) Teeth Intact   Pulmonary former smoker,    breath sounds clear to auscultation       Cardiovascular  Rhythm:regular Rate:Normal     Neuro/Psych    GI/Hepatic   Endo/Other    Renal/GU      Musculoskeletal   Abdominal   Peds  Hematology   Anesthesia Other Findings       Reproductive/Obstetrics (+) Pregnancy                             Anesthesia Physical  Anesthesia Plan  ASA: II  Anesthesia Plan: Epidural   Post-op Pain Management:    Induction:   PONV Risk Score and Plan:   Airway Management Planned:   Additional Equipment:   Intra-op Plan:   Post-operative Plan:   Informed Consent: I have reviewed the patients History and Physical, chart, labs and discussed the procedure including the risks, benefits and alternatives for the proposed anesthesia with the patient or authorized representative who has indicated his/her understanding and acceptance.   Dental Advisory Given  Plan Discussed with:   Anesthesia Plan Comments: (Labs checked- platelets confirmed with RN in room. Fetal heart tracing, per RN, reported to be stable enough for sitting procedure. Discussed epidural, and patient consents to the procedure:  included risk of possible headache,backache, failed block, allergic reaction, and nerve injury. This patient was asked if she had any questions or concerns before the procedure started.)        Anesthesia Quick Evaluation  

## 2017-04-27 NOTE — H&P (Signed)
Diana Palmer is a 31 y.o. female patient [phone] @ 5681w0d presenting for medical induction of labor due to IUGR No diagnosis found. Past Medical History:  Diagnosis Date  . Attention deficit disorder without mention of hyperactivity   . Cervical dysplasia   . Elevated blood pressure reading without diagnosis of hypertension   . History of gestational hypertension    OB History    Gravida Para Term Preterm AB Living   4 1   1 2 1    SAB TAB Ectopic Multiple Live Births     2   0 1     3781w0d Estimated Date of Delivery: 05/18/17 No Known Allergies Active Problems:   Small for gestational age fetus during pregnancy in third trimester   Positive GBS test  Height 5' (1.524 m), weight 125 lb (56.7 kg), last menstrual period 08/11/2016, unknown if currently breastfeeding.  Maternal Medical History:  Contractions: Frequency: irregular.    Fetal activity: Perceived fetal activity is normal.    Prenatal complications: IUGR.   Prenatal Complications - Diabetes: none.    Maternal Exam:  Uterine Assessment: Contraction strength is mild.  Contraction frequency is regular.   Abdomen: Patient reports no abdominal tenderness. Estimated fetal weight is 4-15 per us on 7-13.   Fetal presentation: vertex  Introitus: Normal vulva. Normal vagina.  Pelvis: adequate for delivery.   Cervix: Cervix evaluated by digital exam.     Fetal Exam Fetal Monitor Review: Mode: ultrasound.   Variability: moderate (6-25 bpm).   Pattern: accelerations present.    Fetal State Assessment: Category I - tracings are normal.     Assessment: Early latent labor.  Membrane status: intact.  Fetal well-being: normal.  Pt is Induction for IUGR GBS Positive  Plan:  Cytotec x 1 dose      Hold 2nd dose Cytotec      Place Foley Bulb when able     Abx Prophylaxis for GBS positive status     Anticipate Vaginal Delivery   Elmore GuiseLori A Meric Joye CNM 04/27/2017  2:02pm

## 2017-04-27 NOTE — Progress Notes (Signed)
Assumed care of patient.

## 2017-04-27 NOTE — Progress Notes (Signed)
Subjective: Pt resting in bed.  Still comfortable. Objective: BP 121/78   Pulse 73   Temp 98.3 F (36.8 C) (Oral)   Resp 16   Ht 5' (1.524 m)   Wt 56.7 kg (125 lb)   LMP 08/11/2016   BMI 24.41 kg/m  No intake/output data recorded. No intake/output data recorded.  FHT: Category 1 UC:   irregular, every 2-4 minutes SVE:   Dilation: 2 Effacement (%): 50 Station: 0 Exam by:: Harriett SineNancy, CNM  Pitocin at 2 Discussed insertion of foley balloon.  Foley balloon inserted without difficulty 60cc saline inserted.  PT tolerated procedure well.  Assessment:  G4P0121 at 37 weeks with IUGR induction Cat 1 strip GBS positive  Plan: Monitor progress. Epidural in active labor. Anticipate SVD  Kenney HousemanNancy Jean Prothero CNM, MSN 04/27/2017, 6:13 PM

## 2017-04-27 NOTE — Progress Notes (Signed)
Subjective: Pt comfortable with epidural. Objective: BP 111/85   Pulse 95   Temp 98.1 F (36.7 C) (Oral)   Resp 18   Ht 5' (1.524 m)   Wt 56.7 kg (125 lb)   LMP 08/11/2016   SpO2 100%   BMI 24.41 kg/m  No intake/output data recorded. No intake/output data recorded.  FHT: Category 1 UC:   irregular, every 1-4 minutes SVE:   Dilation: 5.5 Effacement (%): 80 Station: 0 Exam by:: Harriett SineNancy CNM Pitocin at 2 mu AROM clear fluid  Assessment:  G4P0121 at 37 weeks with IUGR induction active labor Cat 1 strip GBS positive  Plan: Anticipate SVD  Kenney HousemanNancy Jean Prothero CNM, MSN 04/27/2017, 11:45 PM

## 2017-04-28 ENCOUNTER — Encounter (HOSPITAL_COMMUNITY): Payer: Self-pay

## 2017-04-28 DIAGNOSIS — O365990 Maternal care for other known or suspected poor fetal growth, unspecified trimester, not applicable or unspecified: Secondary | ICD-10-CM

## 2017-04-28 DIAGNOSIS — Z8759 Personal history of other complications of pregnancy, childbirth and the puerperium: Secondary | ICD-10-CM

## 2017-04-28 LAB — CBC
HEMATOCRIT: 28.8 % — AB (ref 36.0–46.0)
Hemoglobin: 10.1 g/dL — ABNORMAL LOW (ref 12.0–15.0)
MCH: 33.6 pg (ref 26.0–34.0)
MCHC: 35.1 g/dL (ref 30.0–36.0)
MCV: 95.7 fL (ref 78.0–100.0)
Platelets: 181 10*3/uL (ref 150–400)
RBC: 3.01 MIL/uL — ABNORMAL LOW (ref 3.87–5.11)
RDW: 13.1 % (ref 11.5–15.5)
WBC: 15.6 10*3/uL — AB (ref 4.0–10.5)

## 2017-04-28 LAB — RPR: RPR: NONREACTIVE

## 2017-04-28 MED ORDER — SENNOSIDES-DOCUSATE SODIUM 8.6-50 MG PO TABS
2.0000 | ORAL_TABLET | ORAL | Status: DC
  Administered 2017-04-29: 2 via ORAL
  Filled 2017-04-28: qty 2

## 2017-04-28 MED ORDER — LIDOCAINE HCL (PF) 1 % IJ SOLN
INTRAMUSCULAR | Status: AC
  Filled 2017-04-28: qty 30

## 2017-04-28 MED ORDER — ACETAMINOPHEN 325 MG PO TABS: 650.0000 mg | ORAL_TABLET | ORAL | Status: DC | PRN

## 2017-04-28 MED ORDER — ONDANSETRON HCL 4 MG PO TABS: 4.0000 mg | ORAL_TABLET | ORAL | Status: DC | PRN

## 2017-04-28 MED ORDER — IBUPROFEN 600 MG PO TABS
600.0000 mg | ORAL_TABLET | Freq: Four times a day (QID) | ORAL | Status: DC
  Administered 2017-04-28 – 2017-04-29 (×6): 600 mg via ORAL
  Filled 2017-04-28 (×6): qty 1

## 2017-04-28 MED ORDER — TETANUS-DIPHTH-ACELL PERTUSSIS 5-2.5-18.5 LF-MCG/0.5 IM SUSP: 0.5000 mL | Freq: Once | INTRAMUSCULAR | Status: DC

## 2017-04-28 MED ORDER — DIPHENHYDRAMINE HCL 25 MG PO CAPS: 25.0000 mg | ORAL_CAPSULE | Freq: Four times a day (QID) | ORAL | Status: DC | PRN

## 2017-04-28 MED ORDER — DIBUCAINE 1 % RE OINT: 1.0000 "application " | TOPICAL_OINTMENT | RECTAL | Status: DC | PRN

## 2017-04-28 MED ORDER — SIMETHICONE 80 MG PO CHEW: 80.0000 mg | CHEWABLE_TABLET | ORAL | Status: DC | PRN

## 2017-04-28 MED ORDER — BENZOCAINE-MENTHOL 20-0.5 % EX AERO: 1.0000 "application " | INHALATION_SPRAY | CUTANEOUS | Status: DC | PRN

## 2017-04-28 MED ORDER — WITCH HAZEL-GLYCERIN EX PADS: 1.0000 "application " | MEDICATED_PAD | CUTANEOUS | Status: DC | PRN

## 2017-04-28 MED ORDER — ZOLPIDEM TARTRATE 5 MG PO TABS: 5.0000 mg | ORAL_TABLET | Freq: Every evening | ORAL | Status: DC | PRN

## 2017-04-28 MED ORDER — ONDANSETRON HCL 4 MG/2ML IJ SOLN: 4.0000 mg | INTRAMUSCULAR | Status: DC | PRN

## 2017-04-28 MED ORDER — COCONUT OIL OIL
1.0000 "application " | TOPICAL_OIL | Status: DC | PRN
  Administered 2017-04-28: 1 via TOPICAL
  Filled 2017-04-28: qty 120

## 2017-04-28 MED ORDER — PRENATAL MULTIVITAMIN CH
1.0000 | ORAL_TABLET | Freq: Every day | ORAL | Status: DC
  Administered 2017-04-28 – 2017-04-29 (×2): 1 via ORAL
  Filled 2017-04-28 (×2): qty 1

## 2017-04-28 NOTE — Lactation Note (Signed)
This note was copied from a baby's chart. Lactation Consultation Note  Patient Name: Diana Palmer ZOXWR'UToday's Date: 04/28/2017 Reason for consult: Initial assessment;Infant < 6lbs;Other (Comment) (Early Term Infant)   Initial assessment with Exp BF mom of 16 hour old early term infant. Infant with 4 BF for 20-40 minutes and 1 void. LATCH scores 7-8. Mom reports she BF her 1st child for 2 months. He was born at 5233 weeks and was in the NICU for 2 weeks, he did go on to BF at home. Mom plans to BF Diana Palmer for at least 2 months.   LPT infant feeding policy given due to infant size and early term status. Enc mom to feed infant at least every 3 hours and with feeing cues. Enc mom to supplement infant with EBM at this time after each feeding. We did discuss that small infant may need to be supplemented with formula if not BF well and not enough EBM is expressed, mom is ok if formula is needed. Reviewed storage of EBM at room temperature. Spoons and colostrum collections containers in room. Discussed importance of calories for small early term infants and early term infant feeding behaviors.   Inf[facility] at the end of [facility] feeding [facility].. Mom had infant positioned and supported very well with feeding. Mom with compressible breasts and areola with everted nipples. Mom hand expressed very easily and spoon fed Diana Palmer 1 ml colostrum.   DEBP set up with instructions for use on Initiate setting post BF for 15 minutes. Set up, assembling, disassembling and cleaning of pump parts reviewed. Mom pumped with her first child. Enc mom to offer infant any and all EBM when available. Enc parents to keep hat on, decrease stimulation, keep STS as much as possible and feed at least every 3 hours.   BF Resources Handout and LC Brochure given, mom informed of IP/OP Services, BF Support Groups and LC phone #. Mom has Medela PIS at home. Mom and dad without further questions/concerns at this time. Enc mom to call out  for feeding assistance as needed. Report to Clent JacksSydney Noble, RN.   Plan written on white board in room Breast feed infant STS 8-12 x in 24 hours at first feeding cues with no longer that 3 hours between feeds Supplement infant with any EBM available via spoon Pump for 15 minutes with DEBP on Initiate setting Hand express post pumping Rest between feeds.    Maternal Data Formula Feeding for Exclusion: Yes Reason for exclusion: Mother's choice to formula and breast feed on admission Has patient been taught Hand Expression?: Yes Does the patient have breastfeeding experience prior to this delivery?: Yes  Feeding Feeding Type: Breast Fed Length of feed: 25 min  LATCH Score/Interventions Latch: Grasps breast easily, tongue down, lips flanged, rhythmical sucking. Intervention(s): Adjust position;Assist with latch;Breast massage;Breast compression  Audible Swallowing: Spontaneous and intermittent Intervention(s): Skin to skin;Hand expression;Alternate breast massage  Type of Nipple: Everted at rest and after stimulation  Comfort (Breast/Nipple): Soft / non-tender     Hold (Positioning): No assistance needed to correctly position infant at breast.  LATCH Score: 10  Lactation Tools Discussed/[facility] No Pump Review: Setup, frequency, and cleaning;Milk Storage Initiated by:: Noralee StainSharon Hice, RN, IBCLC Date initiated:: 04/28/17   Consult Status Consult Status: Follow-up Date: 04/29/17 Follow-up type: In-patient    Silas FloodSharon S Hice 04/28/2017, 4:52 PM

## 2017-04-28 NOTE — Progress Notes (Addendum)
Subjective: Postpartum Day 0: Vaginal delivery, no laceration Patient up ad lib, reports no syncope or dizziness. Feeding:  Breast Contraceptive plan:  Considering Nexplanon  Hx gestational hypertension in last pregnancy--dc'd home on Procardia, but had HA and tachycardia with that med.  Changed to Labetalol with better toleration, ended use prior to 6 weeks pp.  Objective: Vital signs in last 24 hours: Temp:  [97.9 F (36.6 C)-98.9 F (37.2 C)] 98.9 F (37.2 C) (07/25 0409) Pulse Rate:  [68-116] 81 (07/25 0409) Resp:  [16-18] 16 (07/25 0409) BP: (92-140)/(48-96) 108/78 (07/25 0409) SpO2:  [99 %-100 %] 100 % (07/25 0409)    Vitals:   04/28/17 0259 04/28/17 0409  BP: 128/83 108/78  Pulse: 71 81  Resp: 16 16  Temp: 98.8 F (37.1 C) 98.9 F (37.2 C)   Most recent BP 124/78 at 8a.  Physical Exam:  General: alert Lochia: appropriate Uterine Fundus: firm Perineum: Perineum clear DVT Evaluation: No evidence of DVT seen on physical exam. Negative Homan's sign.   CBC Latest Ref Rng & Units 04/28/2017 04/27/2017 04/02/2017  WBC 4.0 - 10.5 K/uL 15.6(H) 10.9(H) 15.3(H)  Hemoglobin 12.0 - 15.0 g/dL 10.1(L) 11.1(L) 11.0(L)  Hematocrit 36.0 - 46.0 % 28.8(L) 31.8(L) 31.1(L)  Platelets 150 - 400 K/uL 181 187 199     Assessment/Plan: Status post vaginal delivery day 0. Stable Continue current care. Reviewed Nexplanon as option for contraception--will send info home with d/c instructions. May want d/c tomorrow, if baby can go, otherwise will d/c on Friday. Check BP q 4 hours.    Nyra CapesLATHAM, VICKICNM 04/28/2017, 8:31 AM

## 2017-04-28 NOTE — Anesthesia Postprocedure Evaluation (Signed)
Anesthesia Post Note  Patient: Diana Palmer  Procedure(s) Performed: * No procedures listed *     Patient location during evaluation: Mother Baby Anesthesia Type: Epidural Level of consciousness: awake Pain management: pain level controlled Vital Signs Assessment: post-procedure vital signs reviewed and stable Respiratory status: spontaneous breathing Cardiovascular status: stable Postop Assessment: no headache, epidural receding and patient able to bend at knees Anesthetic complications: no    Last Vitals:  Vitals:   04/28/17 0259 04/28/17 0409  BP: 128/83 108/78  Pulse: 71 81  Resp: 16 16  Temp: 37.1 C 37.2 C    Last Pain:  Vitals:   04/28/17 0409  TempSrc: Oral  PainSc: 0-No pain   Pain Goal:                 Edison PaceWILKERSON,Elke Holtry

## 2017-04-29 NOTE — Discharge Summary (Signed)
[facility]   Patient Name:   Diana Palmer DOB:     07/19/1986 MRN:     [id number]  Date of Admission:   04/27/2017 Date of Discharge:  04/29/2017  Admitting diagnosis:    INDUCTION Principal Problem:   SVD (spontaneous vaginal delivery) Active Problems:   Positive GBS test   History of gestational hypertension (in the postpartum period)   IUGR (intrauterine growth restriction) affecting care of mother  Term Pregnancy Delivered and IUGR    Discharge diagnosis:    INDUCTION Principal Problem:   SVD (spontaneous vaginal delivery) Active Problems:   Positive GBS test   History of gestational hypertension (in the postpartum period)   IUGR (intrauterine growth restriction) affecting care of mother  Term Pregnancy Delivered                                                                     Post partum procedures: NA  Type of Delivery:  SVD  Delivering Provider: Kenney HousemanPROTHERO, NANCY JEAN   Date of Delivery:  04/28/17  Newborn Data:    Live born female  Birth Weight: 4 lb 14.7 oz (2230 g) APGARS: 8, 9  Baby's Name:                        [name] Baby Feeding:   Bottle and pumping Disposition:              Bili Lights  Complications:   None  Hospital course:      Induction of Labor With Vaginal Delivery   31 y.o. yo [id number] at 7027w1d was admitted to the hospital 04/27/2017 for induction of labor. Indication for induction: IUGR.  Patient had an uncomplicated labor course as follows: Membrane Rupture Time/Date: 11:40 PM ,04/27/2017   Intrapartum Procedures: Episiotomy: None [1]                                         Lacerations:  None [1]  Patient had delivery of a Viable infant.  Information for the patient's newborn:  Allena NapoleonRobbins, Girl Monserrate [[id number]][[id number]]  Delivery Method: Vag-Spont   04/28/2017  Details of delivery can be found in separate delivery note. Patient had a routine postpartum course. Patient is discharged home 04/29/17 per her  request (prefers to room in). She is ambulating, urinating, passing flatus, had BM, & tolerating a regular diet. Pain fully controlled w/ Tyl/combo. Declined Rx for Ibuprofen - states has some at home.  Physical Exam:   Vitals:   04/29/17 0034 04/29/17 0400 04/29/17 0621 04/29/17 0700  BP: 128/76 133/84 131/85 129/85  Pulse:   76 92  Resp:   16   Temp:   97.6 F (36.4 C)   TempSrc:   Oral   SpO2:   100%   Weight:      Height:       BP range overnight 126-133/76-90. Denies h/a, visual changes, epigastric pain or difficulty breathing.    General: alert, cooperative and no distress  Lungs: CTAB CV: RRR w/o M/R/G Lochia: appropriate Uterine Fundus: firm Incision: N/A DVT Evaluation: No evidence of DVT seen  on physical exam. Negative Homan's sign. No cords or calf tenderness. No significant calf/ankle edema.  Labs: Results for orders placed or performed during the hospital encounter of 04/27/17 (from the past 72 hour(s))  CBC     Status: Abnormal   Collection Time: 04/27/17  7:47 AM  Result Value Ref Range   WBC 10.9 (H) 4.0 - 10.5 K/uL   RBC 3.34 (L) 3.87 - 5.11 MIL/uL   Hemoglobin 11.1 (L) 12.0 - 15.0 g/dL   HCT 54.031.8 (L) 98.136.0 - 19.146.0 %   MCV 95.2 78.0 - 100.0 fL   MCH 33.2 26.0 - 34.0 pg   MCHC 34.9 30.0 - 36.0 g/dL   RDW 47.813.2 29.511.5 - 62.115.5 %   Platelets 187 150 - 400 K/uL  RPR     Status: None   Collection Time: 04/27/17  7:47 AM  Result Value Ref Range   RPR Ser Ql Non Reactive Non Reactive    Comment: (NOTE) Performed At: [facility]  [address] [place], [place] [id number] Mila HomerHancock William F MD NG:[id number]:[phone]   Type and screen     Status: None   Collection Time: 04/27/17  7:47 AM  Result Value Ref Range   ABO/RH(D) A POS    Antibody Screen NEG    Sample Expiration 04/30/2017   CBC     Status: Abnormal   Collection Time: 04/28/17  5:39 AM  Result Value Ref Range   WBC 15.6 (H) 4.0 - 10.5 K/uL   RBC 3.01 (L) 3.87 - 5.11 MIL/uL   Hemoglobin 10.1 (L)  12.0 - 15.0 g/dL   HCT 40.128.8 (L) 02.736.0 - 25.346.0 %   MCV 95.7 78.0 - 100.0 fL   MCH 33.6 26.0 - 34.0 pg   MCHC 35.1 30.0 - 36.0 g/dL   RDW 66.413.1 40.311.5 - 47.415.5 %   Platelets 181 150 - 400 K/uL     Discharge instruction: per After Visit Summary and "Baby and Me Booklet".  After Visit Meds:  Allergies as of 04/29/2017   No Known Allergies     Medication List    TAKE these medications   prenatal multivitamin Tabs tablet Take 1 tablet by mouth daily at 12 noon.       Diet: routine diet  Activity: Advance as tolerated. Pelvic rest for 6 weeks.   Outpatient follow up:6 weeks  Postpartum contraception: Nexplanon (information placed in packet)  04/29/2017 Sherre ScarletWILLIAMS, Yvonne Stopher, CNM

## 2017-04-29 NOTE — Discharge Instructions (Signed)
Etonogestrel implant What is this medicine? ETONOGESTREL (et oh noe JES trel) is a contraceptive (birth control) device. It is used to prevent pregnancy. It can be used for up to 3 years. This medicine may be used for other purposes; ask your health care provider or pharmacist if you have questions. COMMON BRAND NAME(S): Implanon, Nexplanon What should I tell my health care provider before I take this medicine? They need to know if you have any of these conditions: -abnormal vaginal bleeding -blood vessel disease or blood clots -cancer of the breast, cervix, or liver -depression -diabetes -gallbladder disease -headaches -heart disease or recent heart attack -high blood pressure -high cholesterol -kidney disease -liver disease -renal disease -seizures -tobacco smoker -an unusual or allergic reaction to etonogestrel, other hormones, anesthetics or antiseptics, medicines, foods, dyes, or preservatives -pregnant or trying to get pregnant -breast-feeding How should I use this medicine? This device is inserted just under the skin on the inner side of your upper arm by a health care professional. Talk to your pediatrician regarding the use of this medicine in children. Special care may be needed. Overdosage: If you think you have taken too much of this medicine contact a poison control center or emergency room at once. NOTE: This medicine is only for you. Do not share this medicine with others. What if I miss a dose? This does not apply. What may interact with this medicine? Do not take this medicine with any of the following medications: -amprenavir -bosentan -fosamprenavir This medicine may also interact with the following medications: -barbiturate medicines for inducing sleep or treating seizures -certain medicines for fungal infections like ketoconazole and itraconazole -grapefruit juice -griseofulvin -medicines to treat seizures like carbamazepine, felbamate,  oxcarbazepine, phenytoin, topiramate -modafinil -phenylbutazone -rifampin -rufinamide -some medicines to treat HIV infection like atazanavir, indinavir, lopinavir, nelfinavir, tipranavir, ritonavir -St. John's wort This list may not describe all possible interactions. Give your health care provider a list of all the medicines, herbs, non-prescription drugs, or dietary supplements you use. Also tell them if you smoke, drink alcohol, or use illegal drugs. Some items may interact with your medicine. What should I watch for while using this medicine? This product does not protect you against HIV infection (AIDS) or other sexually transmitted diseases. You should be able to feel the implant by pressing your fingertips over the skin where it was inserted. Contact your doctor if you cannot feel the implant, and use a non-hormonal birth control method (such as condoms) until your doctor confirms that the implant is in place. If you feel that the implant may have broken or become bent while in your arm, contact your healthcare provider. What side effects may I notice from receiving this medicine? Side effects that you should report to your doctor or health care professional as soon as possible: -allergic reactions like skin rash, itching or hives, swelling of the face, lips, or tongue -breast lumps -changes in emotions or moods -depressed mood -heavy or prolonged menstrual bleeding -pain, irritation, swelling, or bruising at the insertion site -scar at site of insertion -signs of infection at the insertion site such as fever, and skin redness, pain or discharge -signs of pregnancy -signs and symptoms of a blood clot such as breathing problems; changes in vision; chest pain; severe, sudden headache; pain, swelling, warmth in the leg; trouble speaking; sudden numbness or weakness of the face, arm or leg -signs and symptoms of liver injury like dark yellow or brown urine; general ill feeling or flu-like  symptoms;  light-colored stools; loss of appetite; nausea; right upper belly pain; unusually weak or tired; yellowing of the eyes or skin -unusual vaginal bleeding, discharge -signs and symptoms of a stroke like changes in vision; confusion; trouble speaking or understanding; severe headaches; sudden numbness or weakness of the face, arm or leg; trouble walking; dizziness; loss of balance or coordination Side effects that usually do not require medical attention (report to your doctor or health care professional if they continue or are bothersome): -acne -back pain -breast pain -changes in weight -dizziness -general ill feeling or flu-like symptoms -headache -irregular menstrual bleeding -nausea -sore throat -vaginal irritation or inflammation This list may not describe all possible side effects. Call your doctor for medical advice about side effects. You may report side effects to FDA at 1-800-FDA-1088. Where should I keep my medicine? This drug is given in a hospital or clinic and will not be stored at home. NOTE: This sheet is a summary. It may not cover all possible information. If you have questions about this medicine, talk to your doctor, pharmacist, or health care provider.  2018 Elsevier/Gold Standard (2016-04-09 11:19:22) Postpartum Depression and Baby Blues The postpartum period begins right after the birth of a baby. During this time, there is often a great amount of joy and excitement. It is also a time of many changes in the life of the parents. Regardless of how many times a mother gives birth, each child brings new challenges and dynamics to the family. It is not unusual to have feelings of excitement along with confusing shifts in moods, emotions, and thoughts. All mothers are at risk of developing postpartum depression or the "baby blues." These mood changes can occur right after giving birth, or they may occur many months after giving birth. The baby blues or postpartum  depression can be mild or severe. Additionally, postpartum depression can go away rather quickly, or it can be a long-term condition. What are the causes? Raised hormone levels and the rapid drop in those levels are thought to be a main cause of postpartum depression and the baby blues. A number of hormones change during and after pregnancy. Estrogen and progesterone usually decrease right after the delivery of your baby. The levels of thyroid hormone and various cortisol steroids also rapidly drop. Other factors that play a role in these mood changes include major life events and genetics. What increases the risk? If you have any of the following risks for the baby blues or postpartum depression, know what symptoms to watch out for during the postpartum period. Risk factors that may increase the likelihood of getting the baby blues or postpartum depression include:  Having a personal or family history of depression.  Having depression while being pregnant.  Having premenstrual mood issues or mood issues related to oral contraceptives.  Having a lot of life stress.  Having marital conflict.  Lacking a social support network.  Having a baby with special needs.  Having health problems, such as diabetes.  What are the signs or symptoms? Symptoms of baby blues include:  Brief changes in mood, such as going from extreme happiness to sadness.  Decreased concentration.  Difficulty sleeping.  Crying spells, tearfulness.  Irritability.  Anxiety.  Symptoms of postpartum depression typically begin within the first month after giving birth. These symptoms include:  Difficulty sleeping or excessive sleepiness.  Marked weight loss.  Agitation.  Feelings of worthlessness.  Lack of interest in activity or food.  Postpartum psychosis is a very serious condition and  can be dangerous. Fortunately, it is rare. Displaying any of the following symptoms is cause for immediate medical  attention. Symptoms of postpartum psychosis include:  Hallucinations and delusions.  Bizarre or disorganized behavior.  Confusion or disorientation.  How is this diagnosed? A diagnosis is made by an evaluation of your symptoms. There are no medical or lab tests that lead to a diagnosis, but there are various questionnaires that a health care provider may use to identify those with the baby blues, postpartum depression, or psychosis. Often, a screening tool called the [place] Postnatal Depression Scale is used to diagnose depression in the postpartum period. How is this treated? The baby blues usually goes away on its own in 1-2 weeks. Social support is often all that is needed. You will be encouraged to get adequate sleep and rest. Occasionally, you may be given medicines to help you sleep. Postpartum depression requires treatment because it can last several months or longer if it is not treated. Treatment may include individual or group therapy, medicine, or both to address any social, physiological, and psychological factors that may play a role in the depression. Regular exercise, a healthy diet, rest, and social support may also be strongly recommended. Postpartum psychosis is more serious and needs treatment right away. Hospitalization is often needed. Follow these instructions at home:  Get as much rest as you can. Nap when the baby sleeps.  Exercise regularly. Some women find yoga and walking to be beneficial.  Eat a balanced and nourishing diet.  Do little things that you enjoy. Have a cup of tea, take a bubble bath, read your favorite magazine, or listen to your favorite music.  Avoid alcohol.  Ask for help with household chores, cooking, grocery shopping, or running errands as needed. Do not try to do everything.  Talk to people close to you about how you are feeling. Get support from your partner, family members, friends, or other new moms.  Try to stay positive in how you  think. Think about the things you are grateful for.  Do not spend a lot of time alone.  Only take over-the-counter or prescription medicine as directed by your health care provider.  Keep all your postpartum appointments.  Let your health care provider know if you have any concerns. Contact a health care provider if: You are having a reaction to or problems with your medicine. Get help right away if:  You have suicidal feelings.  You think you may harm the baby or someone else. This information is not intended to replace advice given to you by your health care provider. Make sure you discuss any questions you have with your health care provider. Document Released: 06/25/2004 Document Revised: 02/27/2016 Document Reviewed: 07/03/2013 Elsevier Interactive Patient Education  2017 Elsevier Inc. ron-Rich Diet Iron is a mineral that helps your body to produce hemoglobin. Hemoglobin is a protein in your red blood cells that carries oxygen to your body's tissues. Eating too little iron may cause you to feel weak and tired, and it can increase your risk for infection. Eating enough iron is necessary for your body's metabolism, muscle function, and nervous system. Iron is naturally found in many foods. It can also be added to foods or fortified in foods. There are two types of dietary iron:  Heme iron. Heme iron is absorbed by the body more easily than nonheme iron. Heme iron is found in meat, poultry, and fish.  Nonheme iron. Nonheme iron is found in dietary supplements, iron-fortified grains,  beans, and vegetables.  You may need to follow an iron-rich diet if:  You have been diagnosed with iron deficiency or iron-deficiency anemia.  You have a condition that prevents you from absorbing dietary iron, such as: ? Infection in your intestines. ? Celiac disease. This involves long-lasting (chronic) inflammation of your intestines.  You do not eat enough iron.  You eat a diet that is high in  foods that impair iron absorption.  You have lost a lot of blood.  You have heavy bleeding during your menstrual cycle.  You are pregnant.  What is my plan? Your health care provider may help you to determine how much iron you need per day based on your condition. Generally, when a person consumes sufficient amounts of iron in the diet, the following iron needs are met:  Men. ? 76-82 years old: 11 mg per day. ? 59-20 years old: 8 mg per day.  Women. ? 46-52 years old: 15 mg per day. ? 75-72 years old: 18 mg per day. ? Over 38 years old: 8 mg per day. ? Pregnant women: 27 mg per day. ? Breastfeeding women: 9 mg per day.  What do I need to know about an iron-rich diet?  Eat fresh fruits and vegetables that are high in vitamin C along with foods that are high in iron. This will help increase the amount of iron that your body absorbs from food, especially with foods containing nonheme iron. Foods that are high in vitamin C include oranges, peppers, tomatoes, and mango.  Take iron supplements only as directed by your health care provider. Overdose of iron can be life-threatening. If you were prescribed iron supplements, take them with orange juice or a vitamin C supplement.  Cook foods in pots and pans that are made from iron.  Eat nonheme iron-containing foods alongside foods that are high in heme iron. This helps to improve your iron absorption.  Certain foods and drinks contain compounds that impair iron absorption. Avoid eating these foods in the same meal as iron-rich foods or with iron supplements. These include: ? Coffee, black tea, and red wine. ? Milk, dairy products, and foods that are high in calcium. ? Beans, soybeans, and peas. ? Whole grains.  When eating foods that contain both nonheme iron and compounds that impair iron absorption, follow these tips to absorb iron better. ? Soak beans overnight before cooking. ? Soak whole grains overnight and drain them before  using. ? Ferment flours before baking, such as using yeast in bread dough. What foods can I eat? Grains Iron-fortified breakfast cereal. Iron-fortified whole-wheat bread. Enriched rice. Sprouted grains. Vegetables Spinach. Potatoes with skin. Green peas. Broccoli. Red and green bell peppers. Fermented vegetables. Fruits Prunes. Raisins. Oranges. Strawberries. Mango. Grapefruit. Meats and Other Protein Sources Beef liver. Oysters. Beef. Shrimp. [place]. Chicken. [place]. Sardines. Chickpeas. Nuts. Tofu. Beverages Tomato juice. Fresh orange juice. Prune juice. Hibiscus tea. Fortified instant breakfast shakes. Condiments Tahini. Fermented soy sauce. Sweets and Desserts Black-strap molasses. Other Wheat germ. The items listed above may not be a complete list of recommended foods or beverages. Contact your dietitian for more options. What foods are not recommended? Grains Whole grains. Bran cereal. Bran flour. Oats. Vegetables Artichokes. Brussels sprouts. Kale. Fruits Blueberries. Raspberries. Strawberries. Figs. Meats and Other Protein Sources Soybeans. Products made from soy protein. Dairy Milk. Cream. Cheese. Yogurt. Cottage cheese. Beverages Coffee. Black tea. Red wine. Sweets and Desserts Cocoa. Chocolate. Ice cream. Other Basil. Oregano. Parsley. The items listed above may  not be a complete list of foods and beverages to avoid. Contact your dietitian for more information. This information is not intended to replace advice given to you by your health care provider. Make sure you discuss any questions you have with your health care provider. Document Released: 05/05/2005 Document Revised: 04/10/2016 Document Reviewed: 04/18/2014 Elsevier Interactive Patient Education  2018 [place]. Postpartum Care After Vaginal Delivery The period of time right after you deliver your newborn is called the postpartum period. What kind of medical care will I receive?  You may continue to  receive fluids and medicines through an IV tube inserted into one of your veins.  If an incision was made near your vagina (episiotomy) or if you had some vaginal tearing during delivery, cold compresses may be placed on your episiotomy or your tear. This helps to reduce pain and swelling.  You may be given a squirt bottle to use when you go to the bathroom. You may use this until you are comfortable wiping as usual. To use the squirt bottle, follow these steps: ? Before you urinate, fill the squirt bottle with warm water. Do not use hot water. ? After you urinate, while you are sitting on the toilet, use the squirt bottle to rinse the area around your urethra and vaginal opening. This rinses away any urine and blood. ? You may do this instead of wiping. As you start healing, you may use the squirt bottle before wiping yourself. Make sure to wipe gently. ? Fill the squirt bottle with clean water every time you use the bathroom.  You will be given sanitary pads to wear. How can I expect to feel?  You may not feel the need to urinate for several hours after delivery.  You will have some soreness and pain in your abdomen and vagina.  If you are breastfeeding, you may have uterine contractions every time you breastfeed for up to several weeks postpartum. Uterine contractions help your uterus return to its normal size.  It is normal to have vaginal bleeding (lochia) after delivery. The amount and appearance of lochia is often similar to a menstrual period in the first week after delivery. It will gradually decrease over the next few weeks to a dry, yellow-brown discharge. For most women, lochia stops completely by 6-8 weeks after delivery. Vaginal bleeding can vary from woman to woman.  Within the first few days after delivery, you may have breast engorgement. This is when your breasts feel heavy, full, and uncomfortable. Your breasts may also throb and feel hard, tightly stretched, warm, and tender.  After this occurs, you may have milk leaking from your breasts.Your health care provider can help you relieve discomfort due to breast engorgement. Breast engorgement should go away within a few days.  You may feel more sad or worried than normal due to hormonal changes after delivery. These feelings should not last more than a few days. If these feelings do not go away after several days, speak with your health care provider. How should I care for myself?  Tell your health care provider if you have pain or discomfort.  Drink enough water to keep your urine clear or pale yellow.  Wash your hands thoroughly with soap and water for at least 20 seconds after changing your sanitary pads, after using the toilet, and before holding or feeding your baby.  If you are not breastfeeding, avoid touching your breasts a lot. Doing this can make your breasts produce more milk.  If  you become weak or lightheaded, or you feel like you might faint, ask for help before: ? Getting out of bed. ? Showering.  Change your sanitary pads frequently. Watch for any changes in your flow, such as a sudden increase in volume, a change in color, the passing of large blood clots. If you pass a blood clot from your vagina, save it to show to your health care provider. Do not flush blood clots down the toilet without having your health care provider look at them.  Make sure that all your vaccinations are up to date. This can help protect you and your baby from getting certain diseases. You may need to have immunizations done before you leave the hospital.  If desired, talk with your health care provider about methods of family planning or birth control (contraception). How can I start bonding with my baby? Spending as much time as possible with your baby is very important. During this time, you and your baby can get to know each other and develop a bond. Having your baby stay with you in your room (rooming in) can give you  time to get to know your baby. Rooming in can also help you become comfortable caring for your baby. Breastfeeding can also help you bond with your baby. How can I plan for returning home with my baby?  Make sure that you have a car seat installed in your vehicle. ? Your car seat should be checked by a certified car seat installer to make sure that it is installed safely. ? Make sure that your baby fits into the car seat safely.  Ask your health care provider any questions you have about caring for yourself or your baby. Make sure that you are able to contact your health care provider with any questions after leaving the hospital. This information is not intended to replace advice given to you by your health care provider. Make sure you discuss any questions you have with your health care provider. Document Released: 07/19/2007 Document Revised: 02/24/2016 Document Reviewed: 08/26/2015 Elsevier Interactive Patient Education  [employer].

## 2017-04-29 NOTE — Lactation Note (Signed)
This note was copied from a baby's chart. Lactation Consultation Note Baby on DPT. Mom has been pumping, and giving baby colostrum in spoon 1-2 ml. Discussed w/mom supplementing w/formula until moms milk comes in. Mom in agreement. At hours of age baby should be getting 12 ml supplement. Encouraged mm to discuss w/MD if wanted supplement amount increased since is on DPT and 6% weight loss. Mom has small round breast, pumping w/noted colostrum. Mom has short shaft everted nipples, baby latches well per mom. Encouraged to call for feeding latch assessment. Encouraged STS, strict I&O. Stressed importance of supplementing. Mom in agreement of supplementing.  Encouraged to post-pump. Teach back done of feeding plan.  Baby supplemented w/colostrum then formula w/o difficulty.  Patient Name: Diana Palmer EAVWU'JToday's Date: 04/29/2017 Reason for consult: Follow-up assessment;Infant < 6lbs;Hyperbilirubinemia;Infant weight loss   Maternal Data    Feeding Feeding Type: Breast Milk with Formula added Nipple Type: Slow - flow Length of feed: 15 min  LATCH Score/Interventions       Type of Nipple: Everted at rest and after stimulation  Comfort (Breast/Nipple): Filling, red/small blisters or bruises, mild/mod discomfort  Problem noted: Mild/Moderate discomfort Interventions (Mild/moderate discomfort): Post-pump;Hand massage;Hand expression  Intervention(s): Breastfeeding basics reviewed;Skin to skin     Lactation Tools Discussed/Used Tools: Pump Breast pump type: Double-Electric Breast Pump   Consult Status Consult Status: Follow-up Date: 04/29/17 (in pm) Follow-up type: In-patient    Charyl DancerCARVER, Courtenay Creger G 04/29/2017, 6:54 AM

## 2017-04-29 NOTE — Lactation Note (Signed)
This note was copied from a baby's chart. Lactation Consultation Note: Mother reports that she is hand expressing approx 2 ml and then she is pumping. She reports that she is not getting but 1-2 ml when she pumps. Mother states that infant latches on and off.  Mother advised to continue to attempt to breastfeed and to pump every 2-3 hours. Advised mother to continue to use LPI supplemental guidelines. Mother to continue to post pump for 15-20 mins. Mother reports that she has an electric pump at home. Advised mother to page for next feeding assistance.  Patient Name: Diana Palmer ZOXWR'UToday's Date: 04/29/2017 Reason for consult: Follow-up assessment   Maternal Data    Feeding Nipple Type: Slow - flow  LATCH Score/Interventions                      Lactation Tools Discussed/Used     Consult Status Consult Status: Follow-up Date: 04/30/17 Follow-up type: In-patient    Stevan BornKendrick, Nitesh Pitstick [facility] 04/29/2017, 3:49 PM

## 2017-04-30 ENCOUNTER — Ambulatory Visit: Payer: Self-pay

## 2017-04-30 NOTE — Lactation Note (Signed)
This note was copied from a baby's chart. Lactation Consultation Note  Patient Name: Diana Nile Dearmily Darling WUJWJ'XToday's Date: 04/30/2017 Reason for consult: Follow-up assessment;Infant < 6lbs;Infant weight loss (6% weight loss , elevated Bili , repeat in am ) Baby is 58 hours old , bili lights D/C and baby is going home today .  LC reviewed doc flow sheets, and updated per parents.  Baby presently sleeping. Per mom plans to go home and work on the breast feeding.  LC reviewed feeding options for a baby < 5 pounds, 6% weight loss, and jaundice.  STS feedings until the abby can stay awake for a feeding.  Option #1 - is the baby is wide awake , offer the breast 15 -20 mins max and supplement afterwards, gradually bring volume up. Post pump for 10 -20 mins.  Option #2 - if baby is sluggish and time to feed , try and appetizer from a bottle, 10 ml and then try to latch, feed 15 -20 mins max and supplement afterwards.  Option #3 - if it is 3 hours and the baby is to sluggish to latch - feed th entire feeding from a bottle, post pump 15 -20 mins.  LC stressed the importance of the 1st 2 weeks of breast feeding and consistent stimulation to the breast to establish and protect milk supply.  Mom is familiar with an early low birth weight baby due to her 1st baby being early and about the same weight.  Per mom didn't experience a lot of engorgement.  LC reviewed sore nipple and engorgement prevention and tx.  Per mom has a DEBP - Medela at home.  LC offered mom and LC O/P appt. And mom declined today and [facility].  Mother informed of post-discharge support and given phone number to the lactation department, including services for phone call assistance; out-patient appointments; and breastfeeding support group. List of other breastfeeding resources in the community given in the handout. Encouraged mother to call for problems or concerns related to breastfeeding.    Maternal  Data    Feeding Feeding Type: Breast Milk with Formula added Nipple Type: Slow - flow  LATCH Score/Interventions                Intervention(s): Breastfeeding basics reviewed     Lactation Tools Discussed/Used Tools: Pump Breast pump type: Double-Electric Breast Pump   Consult Status Consult Status: Complete (mom declined LC Appt. scheduling today, will call for appt. ) Date: 04/30/17    Diana Palmer 04/30/2017, 10:51 AM

## 2017-12-14 IMAGING — US US MFM FETAL BPP W/O NON-STRESS
1 series · 13 of 14 positions shown · non-contrast
Comparison: none

[Series 1: us mfm fetal bpp w/o non-stress · 14 acquisitions, 13 frames shown]
[im 1/14]
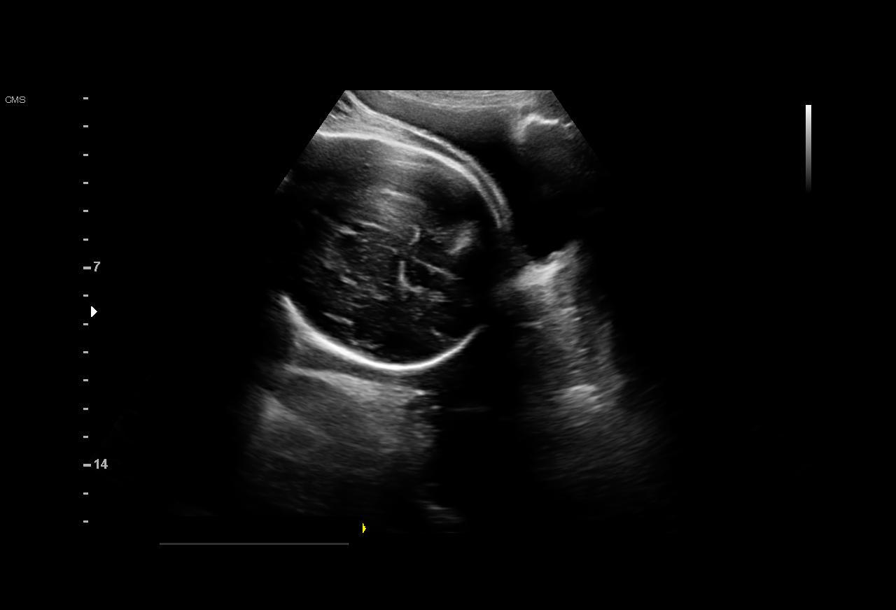
[im 2/14]
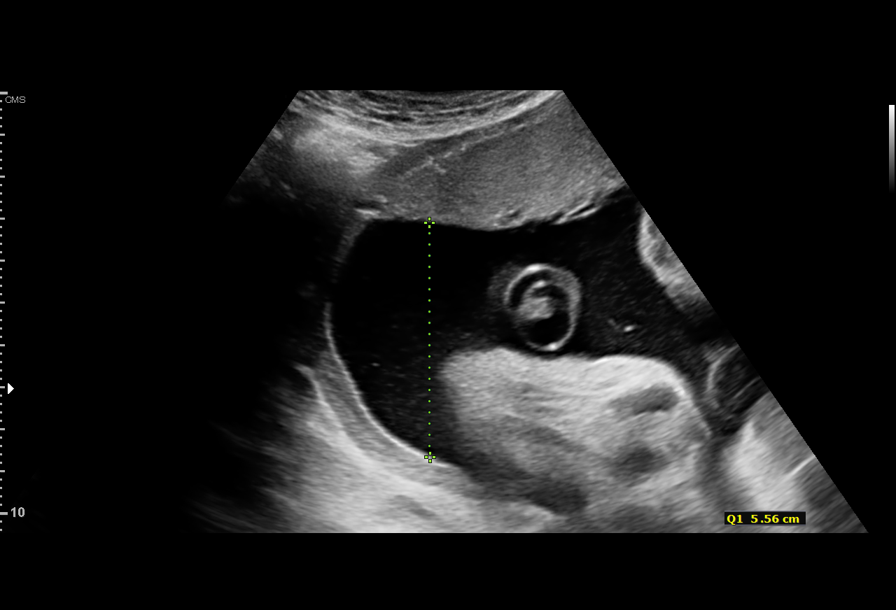
[im 3/14]
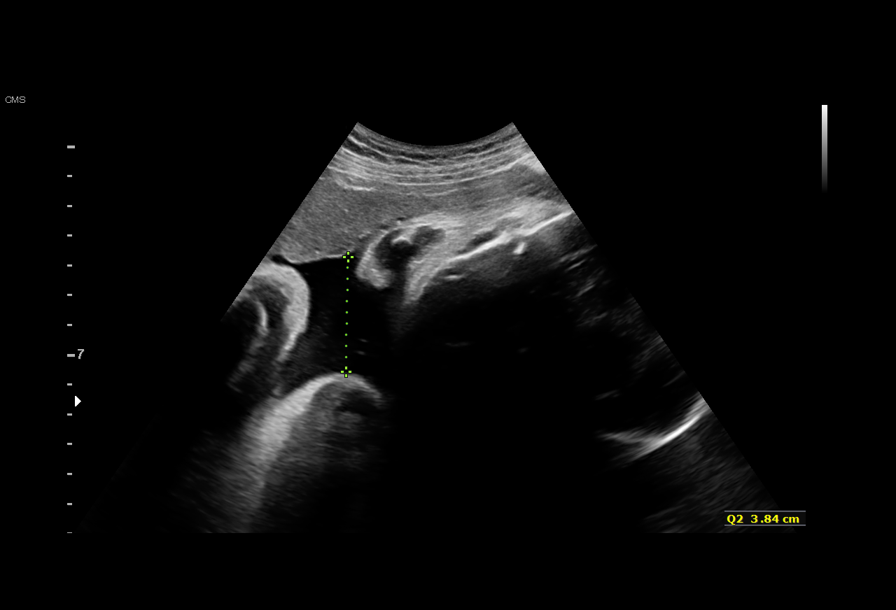
[im 4/14]
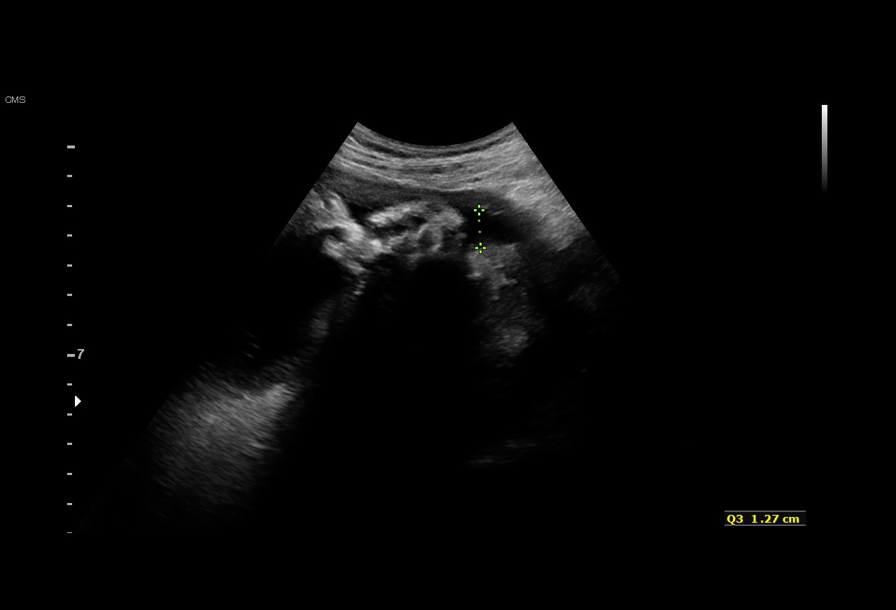
[im 5/14]
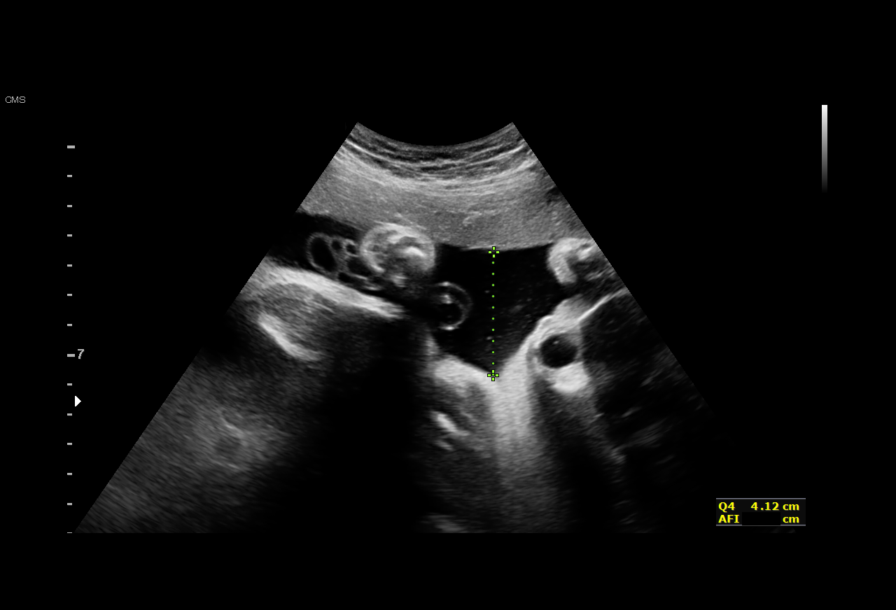
[im 6/14]
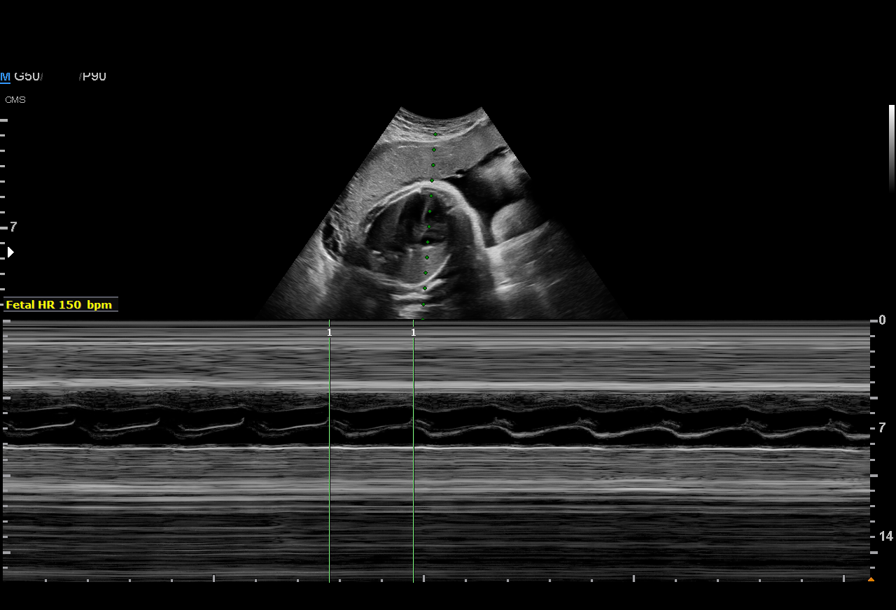
[im 8/14]
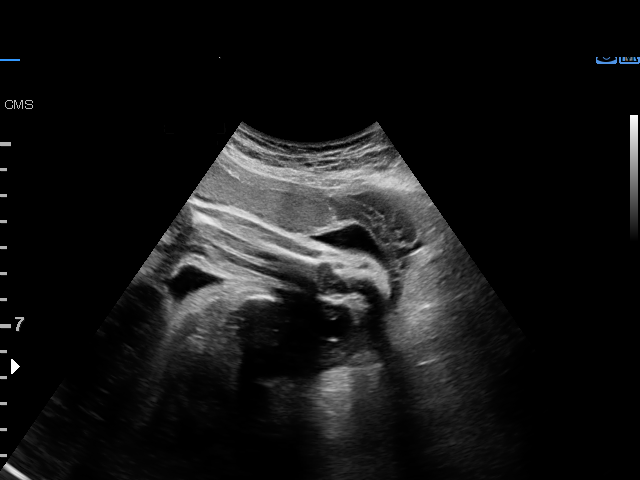
[im 9/14]
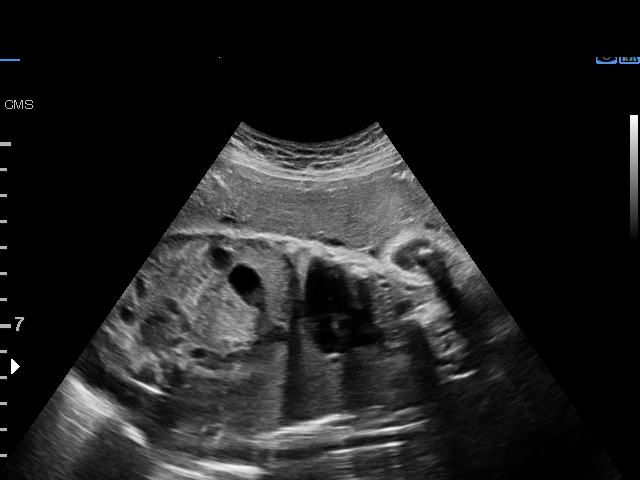
[im 10/14]
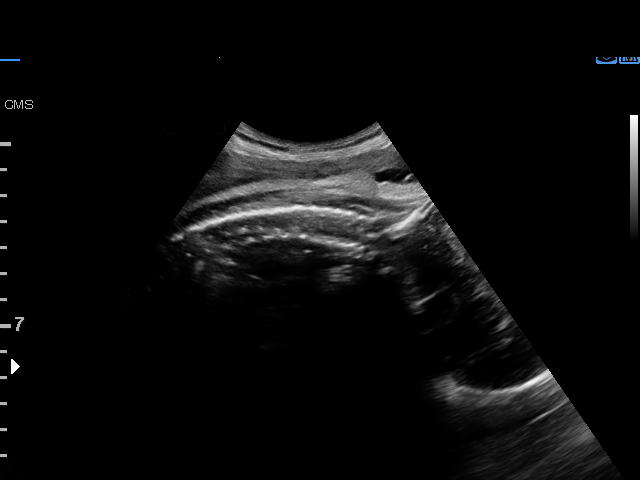
[im 11/14]
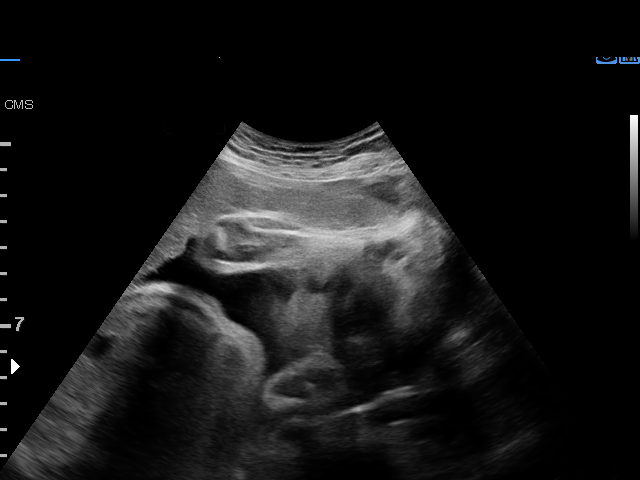
[im 12/14]
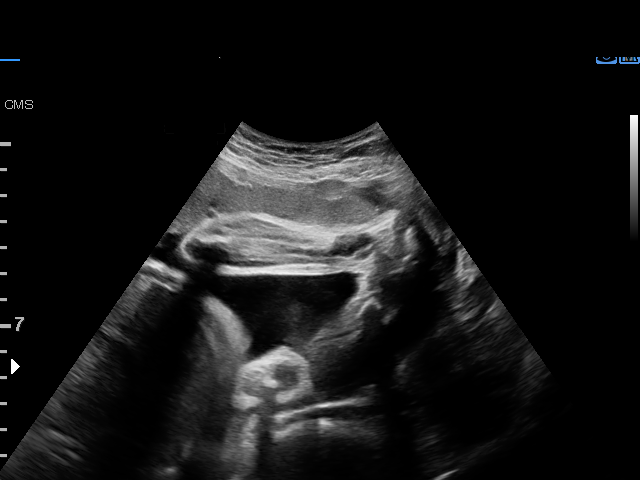
[im 13/14]
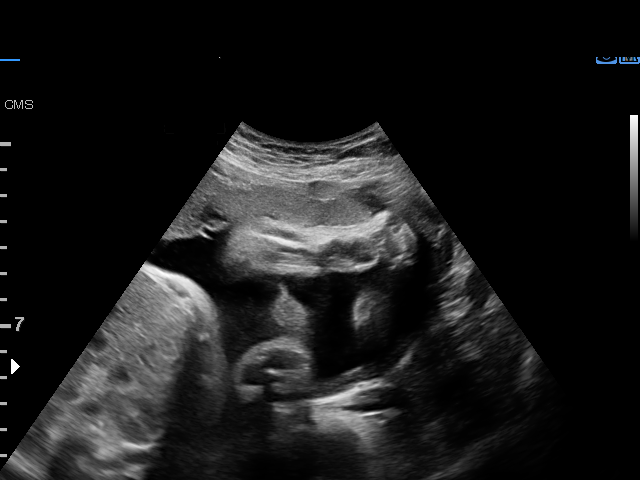
[im 14/14]
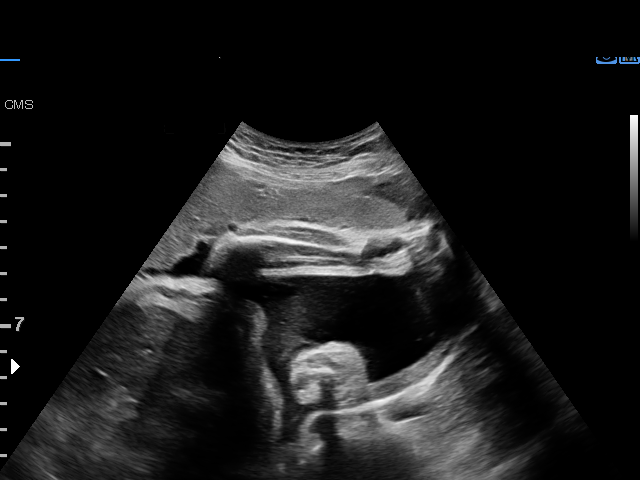

[13 of 14 positions shown; findings below may reference images not displayed]

[facility]/Gyn Clinic

Indications

Preterm contractions
33 weeks gestation of pregnancy
Maternal care for known or suspected poor
fetal growth, third trimester, not applicable or
unspecified
OB History

Gravidity:    4         Term:   0        Prem:   1        SAB:   2
TOP:          0       Ectopic:  0        Living: 1
Fetal Evaluation

Num Of Fetuses:     1
Fetal Heart         150
Rate(bpm):
Cardiac Activity:   Observed
Presentation:       Cephalic
Placenta:           Anterior, above cervical os

Amniotic Fluid
AFI FV:      Subjectively within normal limits

AFI Sum(cm)     %Tile       Largest Pocket(cm)
14.79           52

RUQ(cm)       RLQ(cm)       LUQ(cm)        LLQ(cm)
5.56
Biophysical Evaluation

Amniotic F.V:   Within normal limits       F. Tone:        Observed
F. Movement:    Observed                   Score:          [DATE]
F. Breathing:   Observed
Gestational Age

LMP:           33w 3d       Date:   08/11/16                 EDD:   05/18/17
Best:          33w 3d    Det. By:   LMP  (08/11/16)          EDD:   05/18/17
Impression

SIUP at 33+3 weeks
Normal amniotic fluid volume
BPP [DATE]
Recommendations

Follow-up as clinically indicated

## 2019-08-07 ENCOUNTER — Other Ambulatory Visit: Payer: Self-pay

## 2019-08-07 DIAGNOSIS — Z20822 Contact with and (suspected) exposure to covid-19: Secondary | ICD-10-CM

## 2019-08-08 LAB — NOVEL CORONAVIRUS, NAA: SARS-CoV-2, NAA: NOT DETECTED

## 2019-09-18 ENCOUNTER — Other Ambulatory Visit: Payer: Self-pay

## 2019-09-18 DIAGNOSIS — Z20822 Contact with and (suspected) exposure to covid-19: Secondary | ICD-10-CM

## 2019-09-21 LAB — NOVEL CORONAVIRUS, NAA: SARS-CoV-2, NAA: NOT DETECTED

## 2019-10-26 ENCOUNTER — Other Ambulatory Visit: Payer: BLUE CROSS/BLUE SHIELD

## 2020-07-01 DIAGNOSIS — B001 Herpesviral vesicular dermatitis: Secondary | ICD-10-CM | POA: Diagnosis not present

## 2020-07-15 DIAGNOSIS — D225 Melanocytic nevi of trunk: Secondary | ICD-10-CM | POA: Diagnosis not present

## 2020-07-15 DIAGNOSIS — D2230 Melanocytic nevi of unspecified part of face: Secondary | ICD-10-CM | POA: Diagnosis not present

## 2020-07-15 DIAGNOSIS — L578 Other skin changes due to chronic exposure to nonionizing radiation: Secondary | ICD-10-CM | POA: Diagnosis not present

## 2020-07-15 DIAGNOSIS — Z411 Encounter for cosmetic surgery: Secondary | ICD-10-CM | POA: Diagnosis not present

## 2020-10-28 DIAGNOSIS — K649 Unspecified hemorrhoids: Secondary | ICD-10-CM | POA: Diagnosis not present

## 2020-10-28 DIAGNOSIS — F988 Other specified behavioral and emotional disorders with onset usually occurring in childhood and adolescence: Secondary | ICD-10-CM | POA: Diagnosis not present

## 2021-07-15 DIAGNOSIS — D225 Melanocytic nevi of trunk: Secondary | ICD-10-CM | POA: Diagnosis not present

## 2021-07-15 DIAGNOSIS — D2230 Melanocytic nevi of unspecified part of face: Secondary | ICD-10-CM | POA: Diagnosis not present

## 2021-07-15 DIAGNOSIS — L821 Other seborrheic keratosis: Secondary | ICD-10-CM | POA: Diagnosis not present

## 2021-07-15 DIAGNOSIS — L814 Other melanin hyperpigmentation: Secondary | ICD-10-CM | POA: Diagnosis not present

## 2022-02-04 DIAGNOSIS — J029 Acute pharyngitis, unspecified: Secondary | ICD-10-CM | POA: Diagnosis not present

## 2022-03-31 DIAGNOSIS — K5909 Other constipation: Secondary | ICD-10-CM | POA: Diagnosis not present

## 2022-03-31 DIAGNOSIS — Z1339 Encounter for screening examination for other mental health and behavioral disorders: Secondary | ICD-10-CM | POA: Diagnosis not present

## 2022-03-31 DIAGNOSIS — Z1331 Encounter for screening for depression: Secondary | ICD-10-CM | POA: Diagnosis not present

## 2022-09-02 DIAGNOSIS — Z0000 Encounter for general adult medical examination without abnormal findings: Secondary | ICD-10-CM | POA: Diagnosis not present

## 2022-09-02 DIAGNOSIS — R7989 Other specified abnormal findings of blood chemistry: Secondary | ICD-10-CM | POA: Diagnosis not present

## 2022-09-04 DIAGNOSIS — Z1339 Encounter for screening examination for other mental health and behavioral disorders: Secondary | ICD-10-CM | POA: Diagnosis not present

## 2022-09-04 DIAGNOSIS — Z0000 Encounter for general adult medical examination without abnormal findings: Secondary | ICD-10-CM | POA: Diagnosis not present

## 2022-09-04 DIAGNOSIS — Z1331 Encounter for screening for depression: Secondary | ICD-10-CM | POA: Diagnosis not present

## 2022-09-15 DIAGNOSIS — L089 Local infection of the skin and subcutaneous tissue, unspecified: Secondary | ICD-10-CM | POA: Diagnosis not present

## 2022-09-15 DIAGNOSIS — S0006XA Insect bite (nonvenomous) of scalp, initial encounter: Secondary | ICD-10-CM | POA: Diagnosis not present

## 2022-09-15 DIAGNOSIS — S0086XA Insect bite (nonvenomous) of other part of head, initial encounter: Secondary | ICD-10-CM | POA: Diagnosis not present

## 2022-09-15 DIAGNOSIS — S1096XA Insect bite of unspecified part of neck, initial encounter: Secondary | ICD-10-CM | POA: Diagnosis not present

## 2022-10-29 DIAGNOSIS — Z124 Encounter for screening for malignant neoplasm of cervix: Secondary | ICD-10-CM | POA: Diagnosis not present

## 2022-10-29 DIAGNOSIS — Z01419 Encounter for gynecological examination (general) (routine) without abnormal findings: Secondary | ICD-10-CM | POA: Diagnosis not present

## 2022-10-29 DIAGNOSIS — Z6820 Body mass index (BMI) 20.0-20.9, adult: Secondary | ICD-10-CM | POA: Diagnosis not present

## 2023-01-20 DIAGNOSIS — K5909 Other constipation: Secondary | ICD-10-CM | POA: Diagnosis not present

## 2023-01-20 DIAGNOSIS — F988 Other specified behavioral and emotional disorders with onset usually occurring in childhood and adolescence: Secondary | ICD-10-CM | POA: Diagnosis not present

## 2023-05-12 DIAGNOSIS — J029 Acute pharyngitis, unspecified: Secondary | ICD-10-CM | POA: Diagnosis not present

## 2023-05-20 DIAGNOSIS — F988 Other specified behavioral and emotional disorders with onset usually occurring in childhood and adolescence: Secondary | ICD-10-CM | POA: Diagnosis not present

## 2023-07-20 DIAGNOSIS — K602 Anal fissure, unspecified: Secondary | ICD-10-CM | POA: Diagnosis not present

## 2023-07-20 DIAGNOSIS — K649 Unspecified hemorrhoids: Secondary | ICD-10-CM | POA: Diagnosis not present

## 2023-08-23 DIAGNOSIS — L03111 Cellulitis of right axilla: Secondary | ICD-10-CM | POA: Diagnosis not present

## 2023-08-23 DIAGNOSIS — L02411 Cutaneous abscess of right axilla: Secondary | ICD-10-CM | POA: Diagnosis not present

## 2023-08-23 DIAGNOSIS — Z681 Body mass index (BMI) 19 or less, adult: Secondary | ICD-10-CM | POA: Diagnosis not present

## 2023-09-10 DIAGNOSIS — R7989 Other specified abnormal findings of blood chemistry: Secondary | ICD-10-CM | POA: Diagnosis not present

## 2023-09-14 DIAGNOSIS — Z1331 Encounter for screening for depression: Secondary | ICD-10-CM | POA: Diagnosis not present

## 2023-09-14 DIAGNOSIS — Z1339 Encounter for screening examination for other mental health and behavioral disorders: Secondary | ICD-10-CM | POA: Diagnosis not present

## 2023-09-14 DIAGNOSIS — K5909 Other constipation: Secondary | ICD-10-CM | POA: Diagnosis not present

## 2023-09-14 DIAGNOSIS — Z0000 Encounter for general adult medical examination without abnormal findings: Secondary | ICD-10-CM | POA: Diagnosis not present

## 2024-01-25 DIAGNOSIS — K5904 Chronic idiopathic constipation: Secondary | ICD-10-CM | POA: Diagnosis not present

## 2024-01-25 DIAGNOSIS — R1033 Periumbilical pain: Secondary | ICD-10-CM | POA: Diagnosis not present

## 2024-01-25 DIAGNOSIS — R140 Abdominal distension (gaseous): Secondary | ICD-10-CM | POA: Diagnosis not present

## 2024-01-27 DIAGNOSIS — F988 Other specified behavioral and emotional disorders with onset usually occurring in childhood and adolescence: Secondary | ICD-10-CM | POA: Diagnosis not present

## 2024-02-22 DIAGNOSIS — K5904 Chronic idiopathic constipation: Secondary | ICD-10-CM | POA: Diagnosis not present

## 2024-02-22 DIAGNOSIS — R141 Gas pain: Secondary | ICD-10-CM | POA: Diagnosis not present

## 2024-05-18 DIAGNOSIS — K5909 Other constipation: Secondary | ICD-10-CM | POA: Diagnosis not present

## 2024-11-09 ENCOUNTER — Other Ambulatory Visit: Payer: Self-pay | Admitting: Internal Medicine

## 2024-11-09 DIAGNOSIS — Z1231 Encounter for screening mammogram for malignant neoplasm of breast: Secondary | ICD-10-CM

## 2024-11-13 ENCOUNTER — Ambulatory Visit
